# Patient Record
Sex: Female | Born: 1970 | Race: White | Hispanic: No | Smoking: Never smoker
Health system: Southern US, Community
[De-identification: ages and names within clinical notes are randomized; demographics above are authoritative.]

## PROBLEM LIST (undated history)

## (undated) DIAGNOSIS — J301 Allergic rhinitis due to pollen: Secondary | ICD-10-CM

## (undated) DIAGNOSIS — E8940 Asymptomatic postprocedural ovarian failure: Secondary | ICD-10-CM

## (undated) HISTORY — DX: Allergic rhinitis due to pollen: J30.1

## (undated) HISTORY — PX: ABDOMINAL HYSTERECTOMY: SHX81

## (undated) HISTORY — DX: Asymptomatic postprocedural ovarian failure: E89.40

---

## 1998-04-27 ENCOUNTER — Encounter: Payer: Self-pay | Admitting: Obstetrics & Gynecology

## 1998-04-27 ENCOUNTER — Ambulatory Visit (HOSPITAL_COMMUNITY): Admission: RE | Admit: 1998-04-27 | Discharge: 1998-04-27 | Payer: Self-pay | Admitting: Obstetrics & Gynecology

## 1998-10-09 ENCOUNTER — Inpatient Hospital Stay (HOSPITAL_COMMUNITY): Admission: AD | Admit: 1998-10-09 | Discharge: 1998-10-11 | Payer: Self-pay | Admitting: Obstetrics and Gynecology

## 2000-08-08 ENCOUNTER — Encounter (INDEPENDENT_AMBULATORY_CARE_PROVIDER_SITE_OTHER): Payer: Self-pay | Admitting: Specialist

## 2000-08-08 ENCOUNTER — Ambulatory Visit (HOSPITAL_COMMUNITY): Admission: RE | Admit: 2000-08-08 | Discharge: 2000-08-08 | Payer: Self-pay | Admitting: Obstetrics and Gynecology

## 2001-01-30 ENCOUNTER — Ambulatory Visit (HOSPITAL_COMMUNITY): Admission: RE | Admit: 2001-01-30 | Discharge: 2001-01-30 | Payer: Self-pay | Admitting: Obstetrics and Gynecology

## 2001-01-30 ENCOUNTER — Encounter (INDEPENDENT_AMBULATORY_CARE_PROVIDER_SITE_OTHER): Payer: Self-pay | Admitting: *Deleted

## 2001-01-30 ENCOUNTER — Encounter (INDEPENDENT_AMBULATORY_CARE_PROVIDER_SITE_OTHER): Payer: Self-pay | Admitting: Specialist

## 2001-02-05 ENCOUNTER — Encounter: Payer: Self-pay | Admitting: Obstetrics and Gynecology

## 2001-02-05 ENCOUNTER — Ambulatory Visit (HOSPITAL_COMMUNITY): Admission: RE | Admit: 2001-02-05 | Discharge: 2001-02-05 | Payer: Self-pay | Admitting: Obstetrics and Gynecology

## 2001-07-07 ENCOUNTER — Other Ambulatory Visit: Admission: RE | Admit: 2001-07-07 | Discharge: 2001-07-07 | Payer: Self-pay | Admitting: Obstetrics and Gynecology

## 2001-09-03 ENCOUNTER — Encounter: Payer: Self-pay | Admitting: Obstetrics and Gynecology

## 2001-09-03 ENCOUNTER — Ambulatory Visit (HOSPITAL_COMMUNITY): Admission: RE | Admit: 2001-09-03 | Discharge: 2001-09-03 | Payer: Self-pay | Admitting: Obstetrics and Gynecology

## 2002-02-01 ENCOUNTER — Inpatient Hospital Stay (HOSPITAL_COMMUNITY): Admission: AD | Admit: 2002-02-01 | Discharge: 2002-02-03 | Payer: Self-pay | Admitting: Obstetrics and Gynecology

## 2002-11-10 ENCOUNTER — Other Ambulatory Visit: Admission: RE | Admit: 2002-11-10 | Discharge: 2002-11-10 | Payer: Self-pay | Admitting: Obstetrics and Gynecology

## 2003-11-16 ENCOUNTER — Other Ambulatory Visit: Admission: RE | Admit: 2003-11-16 | Discharge: 2003-11-16 | Payer: Self-pay | Admitting: Obstetrics and Gynecology

## 2004-08-08 ENCOUNTER — Ambulatory Visit (HOSPITAL_COMMUNITY): Admission: RE | Admit: 2004-08-08 | Discharge: 2004-08-08 | Payer: Self-pay | Admitting: Obstetrics and Gynecology

## 2004-11-14 ENCOUNTER — Ambulatory Visit (HOSPITAL_COMMUNITY): Admission: RE | Admit: 2004-11-14 | Discharge: 2004-11-14 | Payer: Self-pay | Admitting: Obstetrics and Gynecology

## 2005-02-12 ENCOUNTER — Other Ambulatory Visit: Admission: RE | Admit: 2005-02-12 | Discharge: 2005-02-12 | Payer: Self-pay | Admitting: Obstetrics and Gynecology

## 2010-01-08 ENCOUNTER — Other Ambulatory Visit: Admission: RE | Admit: 2010-01-08 | Discharge: 2010-01-08 | Payer: Self-pay | Admitting: Registered Nurse

## 2010-09-27 NOTE — H&P (Signed)
[facility]  Patient:    Nicole Vazquez, Nicole Vazquez Visit Number: [id number] MRN: [id number]          Service Type: Attending:  Janine Limbo, M.D. Dictated by:   Janine Limbo, M.D. Adm. Date:  01/30/01                           History and Physical  HISTORY OF PRESENT ILLNESS:   The patient is a 40 year old female, para 2-0-1-2, who presents for diagnostic laparoscopy and laparoscopic ovarian cystectomy.  An ultrasound has shown that the patient has a 21.5 x 17.3 cm adnexal cyst on the right.  Her uterus measures 9.2 x 6.0 cm.  The cyst has grown from 14.5 cm.  The patient underwent a diagnostic laparoscopy in March 2002 at which time she was noted to have bilateral ovarian cyst.  The cysts were removed at the time.  The fluid was clear.  No malignant cells were identified.  The cytology from the cyst showed a benign mucinous cyst.  The left ovarian cyst was thought to be a corpus luteum.  The patient reports that she is having more discomfort at this point, and she wishes to proceed with removal of this cyst.  She does want to maintain her childbearing potential.  OBSTETRICAL HISTORY:          The patient has had two vaginal deliveries at term.  PAST MEDICAL HISTORY:         The patient has had her wisdom teeth removed. She denies hypertension and diabetes.  DRUG ALLERGIES:               None.  SOCIAL HISTORY:               The patient denies cigarette use, alcohol use, and recreational drug use.  REVIEW OF SYSTEMS:            Noncontributory.  FAMILY HISTORY:               Noncontributory.  PHYSICAL EXAMINATION:  VITAL SIGNS:                  Weight is 122 pounds.  HEENT:                        Within normal limits.  CHEST:                        Clear.  HEART:                        Regular rate and rhythm.  BREASTS:                      Without masses.  ABDOMEN:                      Nontender.  A mass can be  palpated.  EXTREMITIES:                  Within normal limits.  NEUROLOGIC:                   Normal.  PELVIC:                       External genitalia is normal.  Vagina is normal. Cervix is  normal.  Uterus is normal size, shape, and consistency.   Adnexa: There is a large mass present in the right adnexa.  Rectovaginal exam confirms.  ASSESSMENT:                   A 21 cm cystic mass on the right ovary.  The patient had a mucinous cyst removed from her right ovary previously this year.  PLAN:                         The patient will undergo a open laparoscopy with drainage of a 21 cm right adnexal cyst.  We will then remove the cyst from the capsule of the ovary.  The patient understands the indications for her procedure, and she accepts the risks of, but not limited to, anesthetic complications, bleeding, infections, and possible damage to the surrounding organs. Dictated by:   Janine Limbo, M.D. Attending:  Janine Limbo, M.D. DD:  01/29/01 TD:  01/29/01 Job: [id number]

## 2010-09-27 NOTE — H&P (Signed)
NAME:  Nicole Vazquez, [place]                     ACCOUNT NO.:  [id number]   MEDICAL RECORD NO.:  [id number]                   PATIENT TYPE:  INP   LOCATION:  9170                                 FACILITY:  [facility]   PHYSICIAN:  Osborn Coho, M.D.                DATE OF BIRTH:  05-14-1970   DATE OF ADMISSION:  02/01/2002  DATE OF DISCHARGE:                                HISTORY & PHYSICAL   HISTORY OF PRESENT ILLNESS:  This is a 40 year old gravida 3, para 2-0-0-2,  at 40-2/7 weeks who presents with complaints of regular uterine contractions  every five minutes x1 to 2 hours.  She denies leaking and reports brown show  and positive fetal movement.  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  2)  History of LGA x2.  3) Family history of neural tube defect. 4) Group B  Strep positive.   PRENATAL LABORATORY DATA:  Hemoglobin 12.8, platelets 185, blood type O  positive, antibody screen negative, RPR nonreactive, rubella immune, HBSAG  negative, Pap test normal, gonorrhea and Chlamydia declined, glucose  challenge within normal limits.  Group B Strep positive.   PAST OBSTETRICAL HISTORY:  Remarkable for spontaneous vaginal delivery in  1998 of a female infant named Rowe weighing 9 pounds complicated by high blood  pressure during labor.  She had a spontaneous vaginal delivery in 2000 of a  female infant named Zack, weighing 9 pounds 10 ounces with no complications.   PAST MEDICAL HISTORY:  Remarkable for history of ovarian cysts with a right  ovarian neoplasm which was deemed to be a low grade/low malignant potential  neoplasm and she had a right oophorectomy for that and is currently  undecided about how to proceed regarding her other ovary.  Medical history  is otherwise unremarkable.   FAMILY HISTORY:  Remarkable for a grandfather with heart pacemaker.  Mother  with breast cancer.   PAST SURGICAL HISTORY:   Remarkable for ovarian cystectomy, right  oophorectomy, and wisdom teeth.   GENETIC HISTORY:  Remarkable for a sister with mental retardation and a  first cousin with neural tube defect.   SOCIAL HISTORY:  Remarkable in that the patient is married to Precious Bard  who is involved and supportive.  She is of the [employer].  She denies  any alcohol, tobacco, or drug use.   PHYSICAL EXAMINATION:  VITAL SIGNS: Stable, afebrile.  White blood cell  count 8.4, hemoglobin 14.4, platelets 191.  HEENT:  Within normal limits.  Thyroid normal, not enlarged.  CHEST:  Clear to auscultation.  HEART:  Regular rate and rhythm.  BREASTS:  Soft and nontender.  ABDOMEN:  Gravid at 40 cm.  Vertex to Leopold's.  EFM shows fetal heart rate  of 140s with small accelerations and occasional early decelerations.  Uterine contractions every two to three minutes.  PELVIC:  Cervical examination  initially was 6 cm and is now 7 cm, 100%  effaced, and 0 station with vertex presentation.  EXTREMITIES: Within normal limits.   ASSESSMENT:  1. Intrauterine pregnancy at term.  2. Active labor.  3. Group B Strep positive.   PLAN:  Admit to birthing suite with Dr. Su Hilt notified.  Routine C.N.M.  orders.  Penicillin prophylaxis.  Epidural.  Anticipate SVD.     Marie L. Williams, C.N.M.                 Osborn Coho, M.D.    MLW/MEDQ  D:  02/01/2002  T:  02/01/2002  Job:  [id number]

## 2010-09-27 NOTE — H&P (Signed)
[facility]  Patient:    Nicole Vazquez, Nicole Vazquez                    MRN: [id number] Adm. Date:  08/08/00 Attending:  Janine Limbo, M.D.                         History and Physical  HISTORY OF PRESENT ILLNESS:  The patient is a 40 year old female, para 2-0-1-2, who presents for diagnostic laparoscopy because of persistent cysts on her ovaries and because of pain.  Ultrasound on July 24, 2000, showed a 12.7 x 5.2 cm cyst on the right and also a 5.0 x 4.2 cm on the right.  On the left there was a 1.5 x 1.4 cm cyst.  OBSTETRICAL HISTORY:  The patient has had 2 vaginal deliveries at term.  DRUG ALLERGIES:  None.  PAST MEDICAL HISTORY:  The patient denies hypertension and diabetes.  She did have her wisdom teeth removed.  SOCIAL HISTORY:  The patient denies cigarette use, alcohol use, and recreational drug use.  REVIEW OF SYSTEMS:  Noncontributory.  FAMILY HISTORY:  Noncontributory.  PHYSICAL EXAMINATION:  GENERAL:  Weight is 125 pounds.  HEENT:  Within normal limits.  CHEST:  Clear.  HEART:  Regular rate and rhythm.  BREASTS:  Without masses.  ABDOMEN:  Nontender.  EXTREMITIES:  Within normal limits.  NEUROLOGIC:  Normal.  PELVIC EXAM:  External genitalia is normal.  Vagina is normal.  CERVIX:  Normal.  UTERUS: Normal size, shape and consistency.  ADNEXA: There is a 10 cm firm mass present on the right, rectovaginal exam confirms.  ASSESSMENT:  12 cm complex cyst on the right ovary.  PLAN:  The patient will undergo a diagnostic laparoscopy.  She understands that there is the possibility that we will need to proceed with laparotomy. The patient understands the indications for her procedure and she accepts the risks of, but not limited to, anesthetic complications, bleeding, infections, and possible damage to the surrounding organs. DD:  08/07/00 TD:  08/07/00 Job: [id number]

## 2010-09-27 NOTE — Op Note (Signed)
[facility]  Patient:    Nicole Vazquez, Nicole Vazquez Visit Number: [id number] MRN: [id number]          Service Type: DSU Location: [facility] Attending Physician:  Leonard Schwartz Dictated by:   Janine Limbo, M.D. Proc. Date: 01/30/01 Admit Date:  01/30/2001                             Operative Report  PREOPERATIVE DIAGNOSES:       1. A 21.5-cm right ovarian cyst.                               2. Scarred right fallopian tube.  POSTOPERATIVE DIAGNOSES:      1. A 21.5 cm right ovarian cyst.                               2. Scarred right fallopian tube.  PROCEDURE:                    1. Diagnostic laparoscopy.                               2. Laparoscopic right salpingo-oophorectomy.  SURGEON:                      Janine Limbo, M.D.  ANESTHESIA:                   General.  DISPOSITION:                  Nicole Vazquez is a 40 year old female who presents with a 21-cm cyst on her right ovary. In March of 2002 the patient underwent a diagnostic laparoscopy with removal of a benign mucinous cyst from her right ovary. In August she was found to have a 14-cm cyst and it quickly grew to 21.5 cm. The patient understands the indications for her procedure and she accepts the risk of, but not limited to, anesthetic complications, bleeding, infections, and the possible damage to the surrounding organs. She understands that there is a possibility that we may need to proceed with oophorectomy rather than cystectomy. She also understands that we may need to proceed with diagnostic laparotomy rather than laparoscopy.  FINDINGS:                     There was a 21-cm cystic mass on the right ovary. The fluid inside was moderately thin and clear. There were no excrescences on the surfaces of the ovaries, on the fallopian tubes, or on the bowel. There was no evidence of malignancy. The right fallopian tube was elongated and was adherent to the posterior  surface of the cystic mass. The uterus, left fallopian tube, and left ovary appeared completely normal. The fimbriated end of the left ovary was delicate. The bowel, appendix, and liver were visualized and were thought to be within normal limits. There was no evidence of endometriosis present.  DESCRIPTION OF PROCEDURE:     The patient was taken to the operating room where a general anesthetic was given. The patients abdomen, perineum, and vagina were prepped with multiple layers of Betadine. Examination under anesthesia was performed. A Foley catheter was placed in the bladder. A Hulka tenaculum was  placed inside the uterus. The patient was sterilely draped. The subumbilical area was injected with 5 cc of 0.5% Marcaine. An incision was made and it was carried sharply through the subcutaneous layer, the fascia, and the anterior peritoneum. Interrupted sutures of O Vicryl were placed in the corners of the incision. A needle was placed inside the mass and 200 cc of fluid was removed. At this point, we felt comfortable placing the Hasson cannula. It was held into place using O Vicryl. The pelvic structures were visualized with findings as mentioned above. Pictures were taken of the patients pelvic anatomy. The ureters were identified bilaterally. They were noted to be away from our surgical areas. The capsule of the cystic mass was then cauterized using the unipolar cautery. An incision was made and the cyst was dissected from the ovarian tissue. Bleeding was encountered as we proceeded with our dissection. Hemostasis was achieved using the bipolar cautery. We were able to dissect the cystic structure from the ovarian stroma. Again, brisk bleeding was encountered. At this point, we noted that the right fallopian tube was elongated and slightly scarred. The decision was made to proceed with right salpingo-oophorectomy because of our concern that the fallopian tube would not be functional,  because of our concern for recurrence of the mass, and because we had already had a significant amount of bleeding from the ovarian stroma. We then isolated the right infundibulopelvic ligament. The ligament was cauterized. Again, care was taken to identify the ureter and to be sure that there was no evidence of damage. The infundibulopelvic ligament was cauterized, cut, and then two O Vicryl Endoloops were placed around the ligament. Hemostasis was noted to be adequate. The pelvis was then vigorously irrigated. The bowel was carefully inspected and there was no evidence of trocar damage. The pneumoperitoneum was allowed to escape. All instruments were removed. The umbilical incision was closed using 4-0 Vicryl. The suprapubic incisions that were used for our surgery were then closed using 4-0 Vicryl. Sponge, needle, and instrument counts were correct on two occasions. The estimated blood loss was 350 cc. The patient tolerated her procedure well. The patient was transported to the recovery room in stable condition. Fluid from the cyst was sent to cytology. The right ovary and the right ovarian cyst were sent to pathology.  FOLLOWUP INSTRUCTIONS:        The patient will return to see Dr. Stefano Gaul in two to three weeks for followup examination. She was given a copy of the postoperative instruction sheet as prepared by the [facility] for patients who have undergone laparoscopy. She was told to call for questions or concerns. Dictated by:   Janine Limbo, M.D. Attending Physician:  Leonard Schwartz DD:  01/30/01 TD:  01/30/01 Job: [id number]

## 2010-09-27 NOTE — Op Note (Signed)
[facility] of [facility]  Patient:    Nicole Vazquez, Nicole Vazquez                  MRN: [id number] Proc. Date: 08/08/00 Adm. Date:  [id number] Attending:  Leonard Schwartz                           Operative Report  PREOPERATIVE DIAGNOSES:       1. Pelvic pain.                               2. Right ovarian cyst, 12 cm.                               3. Left ovarian cyst, 1.5 x 1.2 cm.  POSTOPERATIVE DIAGNOSES:      1. Pelvic pain.                               2. Right ovarian cyst, 12 cm.                               3. Left ovarian cyst, 1.5 x 1.2 cm.  PROCEDURE:                    Diagnostic laparoscopy with laparoscopic bilateral ovarian cystectomies.  SURGEON:                      Janine Limbo, M.D.  ANESTHESIA:                   General.  INDICATIONS:                  The patient is a 40 year old female, para 2-0-0-2 who presents with the above-mentioned diagnoses.  The patient understands the indications for her procedure and she accepts the risks of but not limited to anesthetic complications, bleeding, infection and possible damage to surrounding organs.  FINDINGS:                     There was a 12 cm cystic right ovary.  There was a smaller cyst on the left ovary that measured approximately 2 cm.  The fallopian tubes, uterus, bowel and upper abdomen appeared normal.  There were adhesions between the appendix and the right pelvic side wall.  There was no evidence of endometriosis.  Both cysts contained clear fluid.  DESCRIPTION OF PROCEDURE:     The patient was taken to the operating room, where a general anesthetic was given.  The patients abdomen, perineum and vagina were prepped with multiple layers of Betadine.  Examination under anesthesia was performed.  A Foley catheter was placed in the bladder.  A Hulka tenaculum was placed inside the uterus.  The patient was sterilely draped.  The subumbilical area was injected with 0.5% Marcaine.   An incision was made and a Veress needle was inserted without difficulty.  Proper placement was confirmed using the saline drop test.  A pneumoperitoneum was obtained.  The laparoscopic trocar and then the laparoscope were substituted for the Veress needle.  The pelvic structures were visualized with findings as mentioned above.  Two suprapubic areas were injected with 0.5% Marcaine  and two 5 mm trocars were placed in the lower abdomen.  Pictures were taken of the patients pelvic anatomy.  The ovarian cyst on the right would move.  it was ruptured during the removal process and the fluid was aspirated and sent to cytology.  The capsule of the ovary was completely removed.  Hemostasis was obtained.  We did attempt to suture the capsule back together, but this was unsuccessful.  We felt that, because hemostasis was adequate, that we would not proceed any further with this endeavor.  A smaller cyst in the right ovary was then aspirated and sent with the original.  The left ovary was then injected with saline and the cyst was removed from the left ovary using a combination of blunt and sharp dissection.  This cyst also ruptured during the removal process.  Hemostasis was achieved.  The pelvis was vigorously irrigated.  There was no evidence of oily material inside the abdominal cavity and there was no evidence of a dermoid in either ovary.  We then made a final check for hemostasis and, again, hemostasis was adequate.  All instruments were then removed after the pneumoperitoneum was allowed to escape.  The bowel was carefully inspected and there was no evidence of trocar damage to the bowel.  The abdominal trocar was then removed.  The incisions were closed with using deep and superficial sutures of 4-0 Vicryl.  Sponge, needle and instrument counts were correct.  Estimated blood loss was 20 cc.  The patient tolerated her procedure well.  She was awakened from her anesthetic and taken to the  recovery room in stable condition.  The patient was given IV Cefotan and IV Toradol during her procedure.  FOLLOW-UP INSTRUCTIONS:       The patient has Vicodin at home for pain.  She will use ibuprofen p.r.n., as well.  She will return to see Dr. Stefano Gaul in 2-3 weeks for follow-up examination.  She was given a copy of the post instruction sheet as prepared by the [facility] for patients who have undergone laparoscopy. DD:  08/08/00 TD:  08/08/00 Job: [id number]

## 2011-05-20 ENCOUNTER — Other Ambulatory Visit: Payer: Self-pay | Admitting: Internal Medicine

## 2011-05-20 DIAGNOSIS — Z1231 Encounter for screening mammogram for malignant neoplasm of breast: Secondary | ICD-10-CM

## 2011-06-05 ENCOUNTER — Ambulatory Visit
Admission: RE | Admit: 2011-06-05 | Discharge: 2011-06-05 | Disposition: A | Discharge status: Home / Self Care | Payer: BC Managed Care – PPO | Source: Ambulatory Visit | Attending: Internal Medicine | Admitting: Internal Medicine

## 2011-06-05 DIAGNOSIS — Z1231 Encounter for screening mammogram for malignant neoplasm of breast: Secondary | ICD-10-CM

## 2012-05-18 ENCOUNTER — Other Ambulatory Visit (HOSPITAL_COMMUNITY): Payer: Self-pay | Admitting: Internal Medicine

## 2012-05-18 DIAGNOSIS — Z1231 Encounter for screening mammogram for malignant neoplasm of breast: Secondary | ICD-10-CM

## 2012-06-08 ENCOUNTER — Ambulatory Visit (HOSPITAL_COMMUNITY)
Admission: RE | Admit: 2012-06-08 | Discharge: 2012-06-08 | Disposition: A | Discharge status: Home / Self Care | Payer: BC Managed Care – PPO | Source: Ambulatory Visit | Attending: Internal Medicine | Admitting: Internal Medicine

## 2012-06-08 DIAGNOSIS — Z1231 Encounter for screening mammogram for malignant neoplasm of breast: Secondary | ICD-10-CM | POA: Insufficient documentation

## 2012-06-16 ENCOUNTER — Other Ambulatory Visit: Payer: Self-pay | Admitting: Registered Nurse

## 2012-06-16 ENCOUNTER — Other Ambulatory Visit (HOSPITAL_COMMUNITY)
Admission: RE | Admit: 2012-06-16 | Discharge: 2012-06-16 | Disposition: A | Discharge status: Home / Self Care | Payer: BC Managed Care – PPO | Source: Ambulatory Visit | Attending: Internal Medicine | Admitting: Internal Medicine

## 2012-06-16 DIAGNOSIS — Z01419 Encounter for gynecological examination (general) (routine) without abnormal findings: Secondary | ICD-10-CM | POA: Insufficient documentation

## 2012-09-23 ENCOUNTER — Other Ambulatory Visit: Payer: Self-pay | Admitting: Dermatology

## 2013-06-08 ENCOUNTER — Other Ambulatory Visit (HOSPITAL_COMMUNITY): Payer: Self-pay | Admitting: Internal Medicine

## 2013-06-08 DIAGNOSIS — Z1231 Encounter for screening mammogram for malignant neoplasm of breast: Secondary | ICD-10-CM

## 2013-06-16 ENCOUNTER — Ambulatory Visit (HOSPITAL_COMMUNITY)
Admission: RE | Admit: 2013-06-16 | Discharge: 2013-06-16 | Disposition: A | Discharge status: Home / Self Care | Payer: BC Managed Care – PPO | Source: Ambulatory Visit | Attending: Internal Medicine | Admitting: Internal Medicine

## 2013-06-16 DIAGNOSIS — Z1231 Encounter for screening mammogram for malignant neoplasm of breast: Secondary | ICD-10-CM | POA: Insufficient documentation

## 2014-09-22 ENCOUNTER — Other Ambulatory Visit (HOSPITAL_COMMUNITY): Payer: Self-pay | Admitting: Internal Medicine

## 2014-09-22 DIAGNOSIS — Z1231 Encounter for screening mammogram for malignant neoplasm of breast: Secondary | ICD-10-CM

## 2014-09-29 ENCOUNTER — Ambulatory Visit (HOSPITAL_COMMUNITY)
Admission: RE | Admit: 2014-09-29 | Discharge: 2014-09-29 | Disposition: A | Discharge status: Home / Self Care | Payer: 59 | Source: Ambulatory Visit | Attending: Internal Medicine | Admitting: Internal Medicine

## 2014-09-29 DIAGNOSIS — Z1231 Encounter for screening mammogram for malignant neoplasm of breast: Secondary | ICD-10-CM | POA: Diagnosis present

## 2015-09-12 ENCOUNTER — Other Ambulatory Visit: Payer: Self-pay

## 2015-09-12 DIAGNOSIS — Z1231 Encounter for screening mammogram for malignant neoplasm of breast: Secondary | ICD-10-CM

## 2015-10-02 ENCOUNTER — Ambulatory Visit
Admission: RE | Admit: 2015-10-02 | Discharge: 2015-10-02 | Disposition: A | Discharge status: Home / Self Care | Payer: BLUE CROSS/BLUE SHIELD | Source: Ambulatory Visit

## 2015-10-02 DIAGNOSIS — Z1231 Encounter for screening mammogram for malignant neoplasm of breast: Secondary | ICD-10-CM

## 2016-03-20 DIAGNOSIS — D1800 Hemangioma unspecified site: Secondary | ICD-10-CM | POA: Diagnosis not present

## 2016-03-20 DIAGNOSIS — Z85828 Personal history of other malignant neoplasm of skin: Secondary | ICD-10-CM | POA: Diagnosis not present

## 2016-03-20 DIAGNOSIS — D225 Melanocytic nevi of trunk: Secondary | ICD-10-CM | POA: Diagnosis not present

## 2016-03-20 DIAGNOSIS — D2261 Melanocytic nevi of right upper limb, including shoulder: Secondary | ICD-10-CM | POA: Diagnosis not present

## 2016-04-08 DIAGNOSIS — D229 Melanocytic nevi, unspecified: Secondary | ICD-10-CM | POA: Diagnosis not present

## 2017-01-06 DIAGNOSIS — Z1231 Encounter for screening mammogram for malignant neoplasm of breast: Secondary | ICD-10-CM | POA: Diagnosis not present

## 2017-01-06 DIAGNOSIS — C4491 Basal cell carcinoma of skin, unspecified: Secondary | ICD-10-CM | POA: Insufficient documentation

## 2017-01-06 DIAGNOSIS — Z124 Encounter for screening for malignant neoplasm of cervix: Secondary | ICD-10-CM | POA: Diagnosis not present

## 2017-01-06 DIAGNOSIS — Z01411 Encounter for gynecological examination (general) (routine) with abnormal findings: Secondary | ICD-10-CM | POA: Diagnosis not present

## 2017-01-06 DIAGNOSIS — Z6821 Body mass index (BMI) 21.0-21.9, adult: Secondary | ICD-10-CM | POA: Diagnosis not present

## 2017-03-24 DIAGNOSIS — D225 Melanocytic nevi of trunk: Secondary | ICD-10-CM | POA: Diagnosis not present

## 2017-03-24 DIAGNOSIS — D2261 Melanocytic nevi of right upper limb, including shoulder: Secondary | ICD-10-CM | POA: Diagnosis not present

## 2017-03-24 DIAGNOSIS — D1800 Hemangioma unspecified site: Secondary | ICD-10-CM | POA: Diagnosis not present

## 2017-03-24 DIAGNOSIS — Z85828 Personal history of other malignant neoplasm of skin: Secondary | ICD-10-CM | POA: Diagnosis not present

## 2017-07-03 DIAGNOSIS — Z13220 Encounter for screening for lipoid disorders: Secondary | ICD-10-CM | POA: Diagnosis not present

## 2017-07-03 DIAGNOSIS — Z0000 Encounter for general adult medical examination without abnormal findings: Secondary | ICD-10-CM | POA: Diagnosis not present

## 2017-07-03 DIAGNOSIS — Z1329 Encounter for screening for other suspected endocrine disorder: Secondary | ICD-10-CM | POA: Diagnosis not present

## 2017-07-03 DIAGNOSIS — Z114 Encounter for screening for human immunodeficiency virus [HIV]: Secondary | ICD-10-CM | POA: Diagnosis not present

## 2017-07-06 DIAGNOSIS — D72818 Other decreased white blood cell count: Secondary | ICD-10-CM | POA: Diagnosis not present

## 2017-07-06 DIAGNOSIS — Z0000 Encounter for general adult medical examination without abnormal findings: Secondary | ICD-10-CM | POA: Diagnosis not present

## 2017-07-06 DIAGNOSIS — Z681 Body mass index (BMI) 19 or less, adult: Secondary | ICD-10-CM | POA: Diagnosis not present

## 2017-07-07 LAB — LIPID PANEL
Cholesterol: 288 — AB (ref 0–200)
HDL: 140 — AB (ref 35–70)
LDL Cholesterol: 141
Triglycerides: 37 — AB (ref 40–160)

## 2018-01-12 DIAGNOSIS — Z124 Encounter for screening for malignant neoplasm of cervix: Secondary | ICD-10-CM | POA: Diagnosis not present

## 2018-01-12 DIAGNOSIS — Z01411 Encounter for gynecological examination (general) (routine) with abnormal findings: Secondary | ICD-10-CM | POA: Diagnosis not present

## 2018-01-12 DIAGNOSIS — Z6821 Body mass index (BMI) 21.0-21.9, adult: Secondary | ICD-10-CM | POA: Diagnosis not present

## 2018-01-12 DIAGNOSIS — Z1231 Encounter for screening mammogram for malignant neoplasm of breast: Secondary | ICD-10-CM | POA: Diagnosis not present

## 2018-01-12 DIAGNOSIS — N951 Menopausal and female climacteric states: Secondary | ICD-10-CM | POA: Diagnosis not present

## 2018-03-01 DIAGNOSIS — Z681 Body mass index (BMI) 19 or less, adult: Secondary | ICD-10-CM | POA: Diagnosis not present

## 2018-03-01 DIAGNOSIS — J309 Allergic rhinitis, unspecified: Secondary | ICD-10-CM | POA: Diagnosis not present

## 2018-03-01 DIAGNOSIS — J019 Acute sinusitis, unspecified: Secondary | ICD-10-CM | POA: Diagnosis not present

## 2018-06-07 ENCOUNTER — Ambulatory Visit: Payer: BLUE CROSS/BLUE SHIELD | Admitting: Family Medicine

## 2018-07-14 ENCOUNTER — Encounter: Payer: Self-pay | Admitting: Family Medicine

## 2018-07-14 ENCOUNTER — Other Ambulatory Visit: Payer: Self-pay

## 2018-07-14 ENCOUNTER — Ambulatory Visit (INDEPENDENT_AMBULATORY_CARE_PROVIDER_SITE_OTHER): Payer: 59 | Admitting: Family Medicine

## 2018-07-14 VITALS — BP 108/72 | HR 67 | Temp 98.6°F | Resp 14 | Ht 65.0 in | Wt 124.8 lb

## 2018-07-14 DIAGNOSIS — Z23 Encounter for immunization: Secondary | ICD-10-CM

## 2018-07-14 DIAGNOSIS — E049 Nontoxic goiter, unspecified: Secondary | ICD-10-CM | POA: Diagnosis not present

## 2018-07-14 DIAGNOSIS — J301 Allergic rhinitis due to pollen: Secondary | ICD-10-CM

## 2018-07-14 DIAGNOSIS — E8940 Asymptomatic postprocedural ovarian failure: Secondary | ICD-10-CM

## 2018-07-14 HISTORY — DX: Allergic rhinitis due to pollen: J30.1

## 2018-07-14 NOTE — Progress Notes (Signed)
Subjective  CC:  Chief Complaint  Patient presents with  . Establish Care    Previous pt of Dr. Dewey/P.A. Dot Been.. Last CPE 05/2017  . Adenopathy    Left side of neck, after sinus infection    HPI: Nicole Vazquez is a 48 y.o. female who presents to [place] at [facility] today to establish care with me as a new patient.   She has the following concerns or needs:  Very pleasant 48 year old married female, mother of 2 sons, presents to establish care.  Overall very healthy.  Lives a healthy lifestyle.  Eats well, exercises and takes multiple over-the-counter supplements, mainly herbal.  She has no chronic medical conditions although she has minor allergy and sinus symptoms more related to cold and weather changes.  In the fall she had a sinus infection that was treated.  At that time she noticed a fullness in her left side of her neck.  She thought this was related to lymphadenopathy.  She never had tenderness or felt a localized mass.  This fullness has persisted.  She denies odynophagia or dysphagia.  No shortness of breath.  No change in energy level.  No palpitations, sweats or unwanted weight changes.  Her mother passed away from metastatic breast cancer.  She had some atypical possibly ovarian epithelial cells on an ovarian cyst path which led her to have a complete hysterectomy, supracervical.  She is concerned about this fullness being related to cancer.  She is up-to-date on her mammogram and Pap smears.  She most recently saw Dr. Raphael Gibney.  We will request records.  Assessment  1. Enlarged thyroid   2. Seasonal allergic rhinitis due to pollen   3. Need for immunization against influenza   4. Premature surgical menopause      Plan   Enlarged thyroid, left greater than right: Refer for ultrasound.  Counseling given.  No palpable nodules or masses.  No lymphadenopathy.  Reassured.  Follow-up pending ultrasound results.  She will return for complete  physical at which time we will check thyroid functions as well.  Mild allergies, supportive care  Premature surgical menopause: Was placed on HRT only briefly due to mother's history of breast cancer.  Discussed risk of osteoporosis.  Continue calcium, vitamin D and weightbearing exercises.  Currently without menopausal symptoms.  She does take a women's health herbal supplement.  Updated influenza vaccination today.  May be due for Tdap, she will check.  Return for complete physical.  Follow up:  Return in about 3 months (around 10/14/2018) for complete physical. Orders Placed This Encounter  Procedures  . US THYROID  . Flu Vaccine QUAD 36+ mos IM   No orders of the defined types were placed in this encounter.    Depression screen PHQ 2/9 07/14/2018  Decreased Interest 0  Down, Depressed, Hopeless 0  PHQ - 2 Score 0    We updated and reviewed the patient's past history in detail and it is documented below.  Patient Active Problem List   Diagnosis Date Noted  . Seasonal allergic rhinitis due to pollen 07/14/2018  . Premature surgical menopause    Health Maintenance  Topic Date Due  . HIV Screening  12/05/1985  . TETANUS/TDAP  12/05/1989  . PAP SMEAR-Modifier  06/17/2015  . MAMMOGRAM  10/01/2016  . INFLUENZA VACCINE  12/10/2017   Immunization History  Administered Date(s) Administered  . Influenza,inj,Quad PF,6+ Mos 07/14/2018   Current Meds  Medication Sig  . Ascorbic Acid (  VITAMIN C) 1000 MG tablet Take 1,000 mg by mouth 2 (two) times daily.  Marland Kitchen Echinacea-Goldenseal (ECHINACEA COMB/GOLDEN SEAL PO) Take by mouth.  . Nettle, Urtica Dioica, (NETTLE LEAF PO) Take by mouth 2 (two) times daily.  . Omega-3 1000 MG CAPS Take 1 capsule by mouth daily.  . OREGANO PO Take by mouth.  Marland Kitchen UNABLE TO FIND Med Name: Herbal Equilibrium 2 times a day  . UNABLE TO FIND Med Name: Super Quercetin    Allergies: Patient is allergic to guaifenesin & derivatives and vicodin  [hydrocodone-acetaminophen]. Past Medical History Patient  has a past medical history of Premature surgical menopause and Seasonal allergic rhinitis due to pollen (07/14/2018). Past Surgical History Patient  has a past surgical history that includes Abdominal hysterectomy. Family History: Patient family history includes Arthritis in her father; Asthma in her mother; Atrial fibrillation in her father; Breast cancer in her mother; COPD in her paternal grandmother; Healthy in her son, son, and son; Hearing loss in her maternal grandmother; Heart disease in her paternal grandfather; Mental retardation in her sister. Social History:  Patient  reports that she has never smoked. She has never used smokeless tobacco. She reports that she does not drink alcohol or use drugs.  Review of Systems: Constitutional: negative for fever or malaise Ophthalmic: negative for photophobia, double vision or loss of vision Cardiovascular: negative for chest pain, dyspnea on exertion, or new LE swelling Respiratory: negative for SOB or persistent cough Gastrointestinal: negative for abdominal pain, change in bowel habits or melena Genitourinary: negative for dysuria or gross hematuria Musculoskeletal: negative for new gait disturbance or muscular weakness Integumentary: negative for new or persistent rashes Neurological: negative for TIA or stroke symptoms Psychiatric: negative for SI or delusions Allergic/Immunologic: negative for hives  Patient Care Team    Relationship Specialty Notifications Start End  Leamon Arnt, MD PCP - General Family Medicine  07/14/18   Ena Dawley, MD Consulting Physician Obstetrics and Gynecology  07/14/18     Objective  Vitals: BP 108/72   Pulse 67   Temp 98.6 F (37 C) (Oral)   Resp 14   Ht 5\' 5"  (1.651 m)   Wt 124 lb 12.8 oz (56.6 kg)   SpO2 98%   BMI 20.77 kg/m  General:  Well developed, well nourished, no acute distress, appears healthy Psych:  Alert and  oriented,normal mood and affect HEENT:  Normocephalic, atraumatic, non-icteric sclera, PERRL, oropharynx is without mass or exudate, supple neck without adenopathy, thyromegaly is present with left fullness, no tenderness or nodules Cardiovascular:  RRR without gallop, rub or murmur, nondisplaced PMI Respiratory:  Good breath sounds bilaterally, CTAB with normal respiratory effort Gastrointestinal: normal bowel sounds, soft, non-tender, no noted masses. No HSM MSK: no deformities, contusions. Joints are without erythema or swelling Skin:  Warm, no rashes or suspicious lesions noted Neurologic:    Mental status is normal.  No tremor   Commons side effects, risks, benefits, and alternatives for medications and treatment plan prescribed today were discussed, and the patient expressed understanding of the given instructions. Patient is instructed to call or message via MyChart if he/she has any questions or concerns regarding our treatment plan. No barriers to understanding were identified. We discussed Red Flag symptoms and signs in detail. Patient expressed understanding regarding what to do in case of urgent or emergency type symptoms.   Medication list was reconciled, printed and provided to the patient in AVS. Patient instructions and summary information was reviewed with the patient as  documented in the AVS. This note was prepared with assistance of Dragon voice recognition software. Occasional wrong-word or sound-a-like substitutions may have occurred due to the inherent limitations of voice recognition software

## 2018-07-14 NOTE — Patient Instructions (Signed)
Please return for your annual complete physical; please come fasting. Please sign a release of records for Dr. Doran Stabler office.   Please sign up for Mychart.   We will call you with information regarding your referral appointment. For a thyroid ultrasound. If you do not hear from [place] within the next 2 weeks, please let me know.   Today you were given your influenza vaccination.  Check to see when you had your last Tdap or tetanus vaccine.    It was a pleasure meeting you today! Thank you for choosing [place] to meet your healthcare needs! I truly look forward to working with you. If you have any questions or concerns, please send me a message via Mychart or call the office at [phone].   Goiter  A goiter is an enlarged thyroid gland. The thyroid is located in the lower front of the neck. It makes hormones that affect many body parts and systems, including the system that affects how quickly the body burns fuel for energy (metabolism). Most goiters are painless and are not a cause for concern. Some goiters can affect the way your thyroid makes thyroid hormones. Goiters and conditions that cause goiters can be treated, if necessary. What are the causes? Common causes of this condition include:  Lack (deficiency) of a mineral called iodine. The thyroid gland uses iodine to make thyroid hormones.  Diseases that attack healthy cells in the body (autoimmune diseases) and affect thyroid function, such as Graves' disease or Hashimoto's disease. These diseases may cause the body to produce too much thyroid hormone (hyperthyroidism) or too little of the hormone (hypothyroidism).  Conditions that cause inflammation of the thyroid (thyroiditis).  One or more small growths on the thyroid (nodular goiter). Other causes include:  Medical problems caused by abnormal genes that are passed from parent to child (genetic defects).  Thyroid injury or infection.  Tumors that may or may not be  cancerous.  Pregnancy.  Certain medicines.  Exposure to radiation. In some cases, the cause may not be known. What increases the risk? This condition is more likely to develop in:  People who do not get enough iodine in their diet.  People who have a family history of goiter.  Women.  People who are older than age 59.  People who smoke tobacco.  People who have had exposure to radiation. What are the signs or symptoms? The main symptom of this condition is swelling in the lower, front part of the neck. This swelling can range from a very small bump to a large lump. Other symptoms may include:  A tight feeling in the throat.  A hoarse voice.  Coughing.  Wheezing.  Difficulty swallowing or breathing.  Bulging veins in the neck.  Dizziness. When a goiter is the result of an overactive thyroid (hyperthyroidism), symptoms may also include:  Nervousness or restlessness.  Inability to tolerate heat.  Unexplained weight loss.  Diarrhea.  Change in the texture of hair or skin.  Changes in heartbeat, such as skipped beats, extra beats, or a rapid heart rate.  Loss of menstruation.  Shaky hands.  Increased appetite.  Sleep problems. When a goiter is the result of an underactive thyroid (hypothyroidism), symptoms may also include:  Feeling like you have no energy (lethargy).  Inability to tolerate cold.  Weight gain that is not explained by a change in diet or exercise habits.  Dry skin.  Coarse hair.  Irregular menstrual periods.  Constipation.  Sadness or depression.  Fatigue. In  some cases, there may not be any symptoms and the thyroid hormone levels may be normal. How is this diagnosed? This condition may be diagnosed based on your symptoms, your medical history, and a physical exam. You may have tests, such as:  Blood tests to check thyroid function.  Imaging tests, such as: ? Ultrasound. ? CT scan. ? MRI. ? Thyroid scan.  Removal of  a tissue sample (biopsy) of the goiter or any nodules. The sample will be tested to check for cancer. How is this treated? Treatment for this condition depends on the cause and your symptoms. Treatment may include:  Medicines to regulate thyroid hormone levels.  Anti-inflammatory medicines or steroid medicines, if the goiter is caused by inflammation.  Iodine supplements or changes to your diet, if the goiter is caused by iodine deficiency.  Radioactive iodine treatment.  Surgery to remove your thyroid. In some cases, you may only need regular check-ups with your health care provider to monitor your condition, and you may not need treatment. Follow these instructions at home:  Follow instructions from your health care provider about any changes to your diet.  Take over-the-counter and prescription medicines only as told by your health care provider. These include supplements.  Do not use any products that contain nicotine or tobacco, such as cigarettes and e-cigarettes. If you need help quitting, ask your health care provider.  Keep all follow-up visits as told by your health care provider. This is important. Contact a health care provider if:  Your symptoms do not get better with treatment.  You have nausea, vomiting, or diarrhea. Get help right away if:  You have sudden, unexplained confusion or other mental changes.  You have a fever.  You have chest pain.  You have trouble breathing or swallowing.  You suddenly become very weak.  You experience extreme restlessness.  You feel your heart racing. Summary  A goiter is an enlarged thyroid gland.  The thyroid gland is located in the lower front of the neck. It makes hormones that affect many body parts and systems, including the system that affects how quickly the body burns fuel for energy (metabolism).  The main symptom of this condition is swelling in the lower, front part of the neck. This swelling can range from a  very small bump to a large lump.  Treatment for this condition depends on the cause and your symptoms. You may need medicines, supplements, or regular monitoring of your condition. This information is not intended to replace advice given to you by your health care provider. Make sure you discuss any questions you have with your health care provider. Document Released: 10/16/2009 Document Revised: 01/22/2017 Document Reviewed: 01/22/2017 Elsevier Interactive Patient Education  [employer].

## 2018-07-16 ENCOUNTER — Other Ambulatory Visit: Payer: 59

## 2018-07-23 ENCOUNTER — Ambulatory Visit
Admission: RE | Admit: 2018-07-23 | Discharge: 2018-07-23 | Disposition: A | Discharge status: Home / Self Care | Payer: 59 | Source: Ambulatory Visit | Attending: Family Medicine | Admitting: Family Medicine

## 2018-07-23 DIAGNOSIS — E049 Nontoxic goiter, unspecified: Secondary | ICD-10-CM

## 2018-07-26 ENCOUNTER — Encounter: Payer: Self-pay | Admitting: *Deleted

## 2018-09-28 ENCOUNTER — Encounter: Payer: 59 | Admitting: Family Medicine

## 2018-10-29 ENCOUNTER — Other Ambulatory Visit: Payer: Self-pay

## 2018-10-29 DIAGNOSIS — Z20822 Contact with and (suspected) exposure to covid-19: Secondary | ICD-10-CM

## 2018-11-01 LAB — NOVEL CORONAVIRUS, NAA: SARS-CoV-2, NAA: NOT DETECTED

## 2018-12-09 ENCOUNTER — Ambulatory Visit (INDEPENDENT_AMBULATORY_CARE_PROVIDER_SITE_OTHER): Payer: 59 | Admitting: Family Medicine

## 2018-12-09 ENCOUNTER — Encounter: Payer: 59 | Admitting: Family Medicine

## 2018-12-09 ENCOUNTER — Encounter: Payer: Self-pay | Admitting: Family Medicine

## 2018-12-09 ENCOUNTER — Other Ambulatory Visit: Payer: Self-pay

## 2018-12-09 VITALS — BP 106/70 | HR 55 | Temp 98.3°F | Resp 14 | Ht 65.0 in | Wt 124.2 lb

## 2018-12-09 DIAGNOSIS — Z1239 Encounter for other screening for malignant neoplasm of breast: Secondary | ICD-10-CM

## 2018-12-09 DIAGNOSIS — Z23 Encounter for immunization: Secondary | ICD-10-CM

## 2018-12-09 DIAGNOSIS — Z0000 Encounter for general adult medical examination without abnormal findings: Secondary | ICD-10-CM

## 2018-12-09 LAB — COMPREHENSIVE METABOLIC PANEL
ALT: 20 U/L (ref 0–35)
AST: 21 U/L (ref 0–37)
Albumin: 4.6 g/dL (ref 3.5–5.2)
Alkaline Phosphatase: 70 U/L (ref 39–117)
BUN: 14 mg/dL (ref 6–23)
CO2: 27 mEq/L (ref 19–32)
Calcium: 9.7 mg/dL (ref 8.4–10.5)
Chloride: 103 mEq/L (ref 96–112)
Creatinine, Ser: 0.77 mg/dL (ref 0.40–1.20)
GFR: 80.01 mL/min (ref >60.00)
Glucose, Bld: 85 mg/dL (ref 70–99)
Potassium: 4.2 mEq/L (ref 3.5–5.1)
Sodium: 138 mEq/L (ref 135–145)
Total Bilirubin: 0.5 mg/dL (ref 0.2–1.2)
Total Protein: 7 g/dL (ref 6.0–8.3)

## 2018-12-09 LAB — CBC WITH DIFFERENTIAL/PLATELET
Basophils Absolute: 0 10*3/uL (ref 0.0–0.1)
Basophils Relative: 0.7 % (ref 0.0–3.0)
Eosinophils Absolute: 0.1 10*3/uL (ref 0.0–0.7)
Eosinophils Relative: 1.8 % (ref 0.0–5.0)
HCT: 39.6 % (ref 36.0–46.0)
Hemoglobin: 13.3 g/dL (ref 12.0–15.0)
Lymphocytes Relative: 30.6 % (ref 12.0–46.0)
Lymphs Abs: 1.4 10*3/uL (ref 0.7–4.0)
MCHC: 33.6 g/dL (ref 30.0–36.0)
MCV: 91.7 fl (ref 78.0–100.0)
Monocytes Absolute: 0.4 10*3/uL (ref 0.1–1.0)
Monocytes Relative: 7.7 % (ref 3.0–12.0)
Neutro Abs: 2.8 10*3/uL (ref 1.4–7.7)
Neutrophils Relative %: 59.2 % (ref 43.0–77.0)
Platelets: 258 10*3/uL (ref 150.0–400.0)
RBC: 4.32 Mil/uL (ref 3.87–5.11)
RDW: 13.1 % (ref 11.5–15.5)
WBC: 4.7 10*3/uL (ref 4.0–10.5)

## 2018-12-09 LAB — LIPID PANEL
Cholesterol: 284 mg/dL — ABNORMAL HIGH (ref 0–200)
HDL: 135.8 mg/dL (ref >39.00)
LDL Cholesterol: 140 mg/dL — ABNORMAL HIGH (ref 0–99)
NonHDL: 148.36
Total CHOL/HDL Ratio: 2
Triglycerides: 43 mg/dL (ref 0.0–149.0)
VLDL: 8.6 mg/dL (ref 0.0–40.0)

## 2018-12-09 LAB — TSH: TSH: 1.11 u[IU]/mL (ref 0.35–4.50)

## 2018-12-09 NOTE — Progress Notes (Signed)
Subjective  Chief Complaint  Patient presents with  . Annual Exam    Fasting.. Tdap today    HPI: Nicole Vazquez is a 48 y.o. female who presents to [place] at [facility] today for a Female Wellness Visit.   Wellness Visit: annual visit with health maintenance review and exam without Pap   HM: mammo due in September. Pap up to date. Doing well. No new concerns. Due tdap.   Neck fullness w/ history of cancer in family: thyroid ultrasound was normal. Will check tSH today was well.   Healthy and feels great. No concerns  Assessment  1. Annual physical exam   2. Breast cancer screening      Plan  Female Wellness Visit:  Age appropriate Health Maintenance and Prevention measures were discussed with patient. Included topics are cancer screening recommendations, ways to keep healthy (see AVS) including dietary and exercise recommendations, regular eye and dental care, use of seat belts, and avoidance of moderate alcohol use and tobacco use.   BMI: discussed patient's BMI and encouraged positive lifestyle modifications to help get to or maintain a target BMI.  HM needs and immunizations were addressed and ordered. See below for orders. See HM and immunization section for updates.  Routine labs and screening tests ordered including cmp, cbc and lipids where appropriate.  Discussed recommendations regarding Vit D and calcium supplementation (see AVS)  Follow up: Return in about 1 year (around 12/09/2019) for complete physical.   Orders Placed This Encounter  Procedures  . MM DIGITAL SCREENING BILATERAL  . CBC with Differential/Platelet  . Comprehensive metabolic panel  . Lipid panel  . TSH   No orders of the defined types were placed in this encounter.    Lifestyle: Body mass index is 20.67 kg/m. Wt Readings from Last 3 Encounters:  12/09/18 124 lb 3.2 oz (56.3 kg)  07/14/18 124 lb 12.8 oz (56.6 kg)     Patient Active Problem List   Diagnosis  Date Noted  . Seasonal allergic rhinitis due to pollen 07/14/2018  . Premature surgical menopause   . Basal cell carcinoma of skin 01/06/2017   Health Maintenance  Topic Date Due  . TETANUS/TDAP  12/05/1989  . INFLUENZA VACCINE  12/11/2018  . MAMMOGRAM  01/13/2019  . PAP SMEAR-Modifier  01/13/2021  . HIV Screening  Completed   Immunization History  Administered Date(s) Administered  . Influenza,inj,Quad PF,6+ Mos 07/14/2018   We updated and reviewed the patient's past history in detail and it is documented below. Allergies: Patient is allergic to guaifenesin & derivatives and vicodin [hydrocodone-acetaminophen]. Past Medical History Patient  has a past medical history of Premature surgical menopause and Seasonal allergic rhinitis due to pollen (07/14/2018). Past Surgical History Patient  has a past surgical history that includes Abdominal hysterectomy. Family History: Patient family history includes Arthritis in her father; Asthma in her mother; Atrial fibrillation in her father; Breast cancer in her mother; COPD in her paternal grandmother; Healthy in her son, son, and son; Hearing loss in her maternal grandmother; Heart disease in her paternal grandfather; Mental retardation in her sister. Social History:  Patient  reports that she has never smoked. She has never used smokeless tobacco. She reports that she does not drink alcohol or use drugs.  Review of Systems: Constitutional: negative for fever or malaise Ophthalmic: negative for photophobia, double vision or loss of vision Cardiovascular: negative for chest pain, dyspnea on exertion, or new LE swelling Respiratory: negative for SOB or persistent  cough Gastrointestinal: negative for abdominal pain, change in bowel habits or melena Genitourinary: negative for dysuria or gross hematuria, no abnormal uterine bleeding or disharge Musculoskeletal: negative for new gait disturbance or muscular weakness Integumentary: negative for  new or persistent rashes, no breast lumps Neurological: negative for TIA or stroke symptoms Psychiatric: negative for SI or delusions Allergic/Immunologic: negative for hives  Patient Care Team    Relationship Specialty Notifications Start End  Leamon Arnt, MD PCP - General Family Medicine  07/14/18   Ena Dawley, MD Consulting Physician Obstetrics and Gynecology  07/14/18     Objective  Vitals: BP 106/70   Pulse (!) 55   Temp 98.3 F (36.8 C) (Oral)   Resp 14   Ht 5\' 5"  (1.651 m)   Wt 124 lb 3.2 oz (56.3 kg)   SpO2 97%   BMI 20.67 kg/m  General:  Well developed, well nourished, no acute distress  Psych:  Alert and orientedx3,normal mood and affect HEENT:  Normocephalic, atraumatic, non-icteric sclera, PERRL, oropharynx is clear without mass or exudate, supple neck without adenopathy, mass or thyromegaly Cardiovascular:  Normal S1, S2, RRR without gallop, rub or murmur, nondisplaced PMI Respiratory:  Good breath sounds bilaterally, CTAB with normal respiratory effort Gastrointestinal: normal bowel sounds, soft, non-tender, no noted masses. No HSM MSK: no deformities, contusions. Joints are without erythema or swelling. Spine and CVA region are nontender Skin:  Warm, no rashes or suspicious lesions noted Neurologic:    Mental status is normal. CN 2-11 are normal. Gross motor and sensory exams are normal. Normal gait. No tremor Breast Exam: No mass, skin retraction or nipple discharge is appreciated in either breast. No axillary adenopathy. Fibrocystic changes are not noted   Commons side effects, risks, benefits, and alternatives for medications and treatment plan prescribed today were discussed, and the patient expressed understanding of the given instructions. Patient is instructed to call or message via MyChart if he/she has any questions or concerns regarding our treatment plan. No barriers to understanding were identified. We discussed Red Flag symptoms and signs in detail.  Patient expressed understanding regarding what to do in case of urgent or emergency type symptoms.   Medication list was reconciled, printed and provided to the patient in AVS. Patient instructions and summary information was reviewed with the patient as documented in the AVS. This note was prepared with assistance of Dragon voice recognition software. Occasional wrong-word or sound-a-like substitutions may have occurred due to the inherent limitations of voice recognition software

## 2018-12-09 NOTE — Patient Instructions (Addendum)
Please return in 12 months for your annual complete physical; please come fasting.  I will release your lab results to you on your MyChart account with further instructions. Please reply with any questions.   We will call you with information regarding your referral appointment. Mammogram at the [facility]. If you do not hear from [place] within the next 2 weeks, please let me know. It can take 1-2 weeks to get appointments set up with the specialists.   Today you were given your Tdap vaccination. This is good for 10 years and protects you from tetanus, pertussis and diptheria.   If you have any questions or concerns, please don't hesitate to send me a message via MyChart or call the office at [phone]. Thank you for visiting with [place] today! It's our pleasure caring for you.   Preventive Care 60-67 Years Old, Female Preventive care refers to visits with your health care provider and lifestyle choices that can promote health and wellness. This includes:  A yearly physical exam. This may also be called an annual well check.  Regular dental visits and eye exams.  Immunizations.  Screening for certain conditions.  Healthy lifestyle choices, such as eating a healthy diet, getting regular exercise, not using drugs or products that contain nicotine and tobacco, and limiting alcohol use. What can I expect for my preventive care visit? Physical exam Your health care provider will check your:  Height and weight. This may be used to calculate body mass index (BMI), which tells if you are at a healthy weight.  Heart rate and blood pressure.  Skin for abnormal spots. Counseling Your health care provider may ask you questions about your:  Alcohol, tobacco, and drug use.  Emotional well-being.  Home and relationship well-being.  Sexual activity.  Eating habits.  Work and work Statistician.  Method of birth control.  Menstrual cycle.  Pregnancy history. What immunizations do I  need?  Influenza (flu) vaccine  This is recommended every year. Tetanus, diphtheria, and pertussis (Tdap) vaccine  You may need a Td booster every 10 years. Varicella (chickenpox) vaccine  You may need this if you have not been vaccinated. Zoster (shingles) vaccine  You may need this after age 28. Measles, mumps, and rubella (MMR) vaccine  You may need at least one dose of MMR if you were born in 1957 or later. You may also need a second dose. Pneumococcal conjugate (PCV13) vaccine  You may need this if you have certain conditions and were not previously vaccinated. Pneumococcal polysaccharide (PPSV23) vaccine  You may need one or two doses if you smoke cigarettes or if you have certain conditions. Meningococcal conjugate (MenACWY) vaccine  You may need this if you have certain conditions. Hepatitis A vaccine  You may need this if you have certain conditions or if you travel or work in places where you may be exposed to hepatitis A. Hepatitis B vaccine  You may need this if you have certain conditions or if you travel or work in places where you may be exposed to hepatitis B. Haemophilus influenzae type b (Hib) vaccine  You may need this if you have certain conditions. Human papillomavirus (HPV) vaccine  If recommended by your health care provider, you may need three doses over 6 months. You may receive vaccines as individual doses or as more than one vaccine together in one shot (combination vaccines). Talk with your health care provider about the risks and benefits of combination vaccines. What tests do I need? Blood  tests  Lipid and cholesterol levels. These may be checked every 5 years, or more frequently if you are over 62 years old.  Hepatitis C test.  Hepatitis B test. Screening  Lung cancer screening. You may have this screening every year starting at age 64 if you have a 30-pack-year history of smoking and currently smoke or have quit within the past 15  years.  Colorectal cancer screening. All adults should have this screening starting at age 48 and continuing until age 42. Your health care provider may recommend screening at age 18 if you are at increased risk. You will have tests every 1-10 years, depending on your results and the type of screening test.  Diabetes screening. This is done by checking your blood sugar (glucose) after you have not eaten for a while (fasting). You may have this done every 1-3 years.  Mammogram. This may be done every 1-2 years. Talk with your health care provider about when you should start having regular mammograms. This may depend on whether you have a family history of breast cancer.  BRCA-related cancer screening. This may be done if you have a family history of breast, ovarian, tubal, or peritoneal cancers.  Pelvic exam and Pap test. This may be done every 3 years starting at age 13. Starting at age 14, this may be done every 5 years if you have a Pap test in combination with an HPV test. Other tests  Sexually transmitted disease (STD) testing.  Bone density scan. This is done to screen for osteoporosis. You may have this scan if you are at high risk for osteoporosis. Follow these instructions at home: Eating and drinking  Eat a diet that includes fresh fruits and vegetables, whole grains, lean protein, and low-fat dairy.  Take vitamin and mineral supplements as recommended by your health care provider.  Do not drink alcohol if: ? Your health care provider tells you not to drink. ? You are pregnant, may be pregnant, or are planning to become pregnant.  If you drink alcohol: ? Limit how much you have to 0-1 drink a day. ? Be aware of how much alcohol is in your drink. In the [place] one drink equals one 12 oz bottle of beer (355 mL), one 5 oz glass of wine (148 mL), or one 1 oz glass of hard liquor (44 mL). Lifestyle  Take daily care of your teeth and gums.  Stay active. Exercise for at least 30  minutes on 5 or more days each week.  Do not use any products that contain nicotine or tobacco, such as cigarettes, e-cigarettes, and chewing tobacco. If you need help quitting, ask your health care provider.  If you are sexually active, practice safe sex. Use a condom or other form of birth control (contraception) in order to prevent pregnancy and STIs (sexually transmitted infections).  If told by your health care provider, take low-dose aspirin daily starting at age 49. What's next?  Visit your health care provider once a year for a well check visit.  Ask your health care provider how often you should have your eyes and teeth checked.  Stay up to date on all vaccines. This information is not intended to replace advice given to you by your health care provider. Make sure you discuss any questions you have with your health care provider. Document Released: 05/25/2015 Document Revised: 01/07/2018 Document Reviewed: 01/07/2018 Elsevier Patient Education  2020 [employer].

## 2019-02-11 ENCOUNTER — Other Ambulatory Visit: Payer: Self-pay

## 2019-02-11 ENCOUNTER — Ambulatory Visit
Admission: RE | Admit: 2019-02-11 | Discharge: 2019-02-11 | Disposition: A | Discharge status: Home / Self Care | Payer: 59 | Source: Ambulatory Visit | Attending: Family Medicine | Admitting: Family Medicine

## 2019-02-11 DIAGNOSIS — Z1239 Encounter for other screening for malignant neoplasm of breast: Secondary | ICD-10-CM

## 2019-12-12 ENCOUNTER — Other Ambulatory Visit: Payer: Self-pay

## 2019-12-12 ENCOUNTER — Encounter: Payer: Self-pay | Admitting: Family Medicine

## 2019-12-12 ENCOUNTER — Ambulatory Visit (INDEPENDENT_AMBULATORY_CARE_PROVIDER_SITE_OTHER): Payer: Managed Care, Other (non HMO) | Admitting: Family Medicine

## 2019-12-12 VITALS — BP 110/78 | HR 54 | Temp 97.2°F | Resp 14 | Ht 65.0 in | Wt 129.6 lb

## 2019-12-12 DIAGNOSIS — E8940 Asymptomatic postprocedural ovarian failure: Secondary | ICD-10-CM

## 2019-12-12 DIAGNOSIS — Z803 Family history of malignant neoplasm of breast: Secondary | ICD-10-CM | POA: Diagnosis not present

## 2019-12-12 DIAGNOSIS — Z0000 Encounter for general adult medical examination without abnormal findings: Secondary | ICD-10-CM | POA: Diagnosis not present

## 2019-12-12 HISTORY — DX: Family history of malignant neoplasm of breast: Z80.3

## 2019-12-12 LAB — COMPREHENSIVE METABOLIC PANEL
AG Ratio: 1.8 (calc) (ref 1.0–2.5)
ALT: 14 U/L (ref 6–29)
AST: 20 U/L (ref 10–35)
Albumin: 4.6 g/dL (ref 3.6–5.1)
Alkaline phosphatase (APISO): 73 U/L (ref 31–125)
BUN: 18 mg/dL (ref 7–25)
CO2: 27 mmol/L (ref 20–32)
Calcium: 9.8 mg/dL (ref 8.6–10.2)
Chloride: 106 mmol/L (ref 98–110)
Creat: 0.89 mg/dL (ref 0.50–1.10)
Globulin: 2.6 g/dL (calc) (ref 1.9–3.7)
Glucose, Bld: 81 mg/dL (ref 65–99)
Potassium: 4.1 mmol/L (ref 3.5–5.3)
Sodium: 138 mmol/L (ref 135–146)
Total Bilirubin: 0.4 mg/dL (ref 0.2–1.2)
Total Protein: 7.2 g/dL (ref 6.1–8.1)

## 2019-12-12 NOTE — Progress Notes (Signed)
Subjective  Chief Complaint  Patient presents with  . Annual Exam    fasting, c/o dry cuticles    HPI: Nicole Vazquez is a 49 y.o. female who presents to [facility] [place] today for a Female Wellness Visit.   Wellness Visit: annual visit with health maintenance review and exam without Pap   HM: Nl pap and neg HPV in 2019, next due in 2024. mammo due in October. imms up to date. 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  Abdominal hysterectomy was supracervical, still requires Pap smears.  Low risk patient for cervical cancer.  Mammograms are done at the breast center.  Continues to live a very healthy lifestyle.  We discussed cholesterol levels, colon cancer screening options including colonoscopy and Cologuard.  Patient defers for now.  She is average risk.  He has history of thin ridged nails with dry cuticles.  Has discussed with her dermatologist.  Using this alone.  No pain or pus.   Assessment  1. Annual physical exam   2. Premature surgical menopause   3. Family history of breast cancer in mother      Plan  Female Wellness Visit:  Age appropriate Health Maintenance and Prevention measures were discussed with patient. Included topics are cancer screening recommendations, ways to keep healthy (see AVS) including dietary and exercise recommendations, regular eye and dental care, use of seat belts, and avoidance of moderate alcohol use and tobacco use.  Mammogram in October, she will follow-up with me for future Pap smears as her gynecologist has retired.  Discussed osteoporosis prevention with vitamin D, calcium and regular weightbearing exercise.  Will screen early at age 25.  Will defer Cologuard testing for colon cancer screening until age 3.  Discussed risks and benefits.  BMI: discussed patient's BMI and encouraged positive lifestyle modifications to help get to or maintain a target BMI.  HM needs and  immunizations were addressed and ordered. See below for orders. See HM and immunization section for updates.  Up-to-date  Routine labs and screening tests ordered including cmp, cbc and lipids where appropriate.  Discussed recommendations regarding Vit D and calcium supplementation (see AVS)  Follow up: Follow-up 1 year for complete physical  Orders Placed This Encounter  Procedures  . CBC with Differential/Platelet  . Comprehensive metabolic panel  . Hepatitis C antibody  . Lipid panel   No orders of the defined types were placed in this encounter.    Lifestyle: Body mass index is 21.57 kg/m. Wt Readings from Last 3 Encounters:  12/12/19 129 lb 9.6 oz (58.8 kg)  12/09/18 124 lb 3.2 oz (56.3 kg)  07/14/18 124 lb 12.8 oz (56.6 kg)     Patient Active Problem List   Diagnosis Date Noted  . Family history of breast cancer in mother 12/12/2019  . Seasonal allergic rhinitis due to pollen 07/14/2018  . Premature surgical menopause     Supracervical abd hysterectomy with bil salpingo-oopherectomy at age 70 due to atypical cells found on ovarian cyst; HRT for short period: stopped due to mother's history of breast cancer. Requires cervical cancer screens.   . Basal cell carcinoma of skin 01/06/2017   Health Maintenance  Topic Date Due  . Hepatitis C Screening  Never done  . INFLUENZA VACCINE  12/11/2019  . MAMMOGRAM  02/11/2020  . PAP SMEAR-Modifier  01/14/2023  . TETANUS/TDAP  12/08/2028  . COVID-19 Vaccine  Completed  . HIV Screening  Completed  Immunization History  Administered Date(s) Administered  . Influenza,inj,Quad PF,6+ Mos 07/14/2018  . Moderna SARS-COVID-2 Vaccination 07/29/2019, 08/29/2019  . Tdap 12/09/2018   We updated and reviewed the patient's past history in detail and it is documented below. Allergies: Patient is allergic to guaifenesin & derivatives and vicodin [hydrocodone-acetaminophen]. Past Medical History Patient  has a past medical history of  Family history of breast cancer in mother (12/12/2019), Premature surgical menopause, and Seasonal allergic rhinitis due to pollen (07/14/2018). Past Surgical History Patient  has a past surgical history that includes Abdominal hysterectomy. Family History: Patient family history includes Arthritis in her father; Asthma in her mother; Atrial fibrillation in her father; Breast cancer in her mother; COPD in her paternal grandmother; Healthy in her son, son, and son; Hearing loss in her maternal grandmother; Heart disease in her paternal grandfather; Mental retardation in her sister. Social History:  Patient  reports that she has never smoked. She has never used smokeless tobacco. She reports that she does not drink alcohol and does not use drugs.  Review of Systems: Constitutional: negative for fever or malaise Ophthalmic: negative for photophobia, double vision or loss of vision Cardiovascular: negative for chest pain, dyspnea on exertion, or new LE swelling Respiratory: negative for SOB or persistent cough Gastrointestinal: negative for abdominal pain, change in bowel habits or melena Genitourinary: negative for dysuria or gross hematuria, no abnormal uterine bleeding or disharge Musculoskeletal: negative for new gait disturbance or muscular weakness Integumentary: negative for new or persistent rashes, no breast lumps Neurological: negative for TIA or stroke symptoms Psychiatric: negative for SI or delusions Allergic/Immunologic: negative for hives  Patient Care Team    Relationship Specialty Notifications Start End  Leamon Arnt, MD PCP - General Family Medicine  07/14/18   Jari Pigg, MD Consulting Physician Dermatology  12/12/19     Objective  Vitals: BP 110/78   Pulse 54   Temp (!) 97.2 F (36.2 C) (Temporal)   Resp 14   Ht 5\' 5"  (1.651 m)   Wt 129 lb 9.6 oz (58.8 kg)   SpO2 98%   BMI 21.57 kg/m  General:  Well developed, well nourished, no acute distress  Psych:  Alert and  orientedx3,normal mood and affect HEENT:  Normocephalic, atraumatic, non-icteric sclera, supple neck without adenopathy, mass or thyromegaly Cardiovascular:  Normal S1, S2, RRR without gallop, rub or murmur Respiratory:  Good breath sounds bilaterally, CTAB with normal respiratory effort Gastrointestinal: normal bowel sounds, soft, non-tender, no noted masses. No HSM MSK: no deformities, contusions. Joints are without erythema or swelling.  Skin:  Warm, no rashes or suspicious lesions noted, heavily freckled Neurologic:    Mental status is normal. CN 2-11 are normal. Gross motor and sensory exams are normal. Normal gait. No tremor Breast Exam: No mass, skin retraction or nipple discharge is appreciated in either breast. No axillary adenopathy. Fibrocystic changes are not noted   Commons side effects, risks, benefits, and alternatives for medications and treatment plan prescribed today were discussed, and the patient expressed understanding of the given instructions. Patient is instructed to call or message via MyChart if he/she has any questions or concerns regarding our treatment plan. No barriers to understanding were identified. We discussed Red Flag symptoms and signs in detail. Patient expressed understanding regarding what to do in case of urgent or emergency type symptoms.   Medication list was reconciled, printed and provided to the patient in AVS. Patient instructions and summary information was reviewed with the patient as documented in  the AVS. This note was prepared with assistance of Dragon voice recognition software. Occasional wrong-word or sound-a-like substitutions may have occurred due to the inherent limitations of voice recognition software  This visit occurred during the SARS-CoV-2 public health emergency.  Safety protocols were in place, including screening questions prior to the visit, additional usage of staff PPE, and extensive cleaning of exam room while observing  appropriate contact time as indicated for disinfecting solutions.

## 2019-12-12 NOTE — Patient Instructions (Signed)
Please return in 12 months for your annual complete physical; please come fasting.  I will release your lab results to you on your MyChart account with further instructions. Please reply with any questions.    If you have any questions or concerns, please don't hesitate to send me a message via MyChart or call the office at [phone]. Thank you for visiting with [place] today! It's our pleasure caring for you.   Preventive Care 24-49 Years Old, Female Preventive care refers to visits with your health care provider and lifestyle choices that can promote health and wellness. This includes:  A yearly physical exam. This may also be called an annual well check.  Regular dental visits and eye exams.  Immunizations.  Screening for certain conditions.  Healthy lifestyle choices, such as eating a healthy diet, getting regular exercise, not using drugs or products that contain nicotine and tobacco, and limiting alcohol use. What can I expect for my preventive care visit? Physical exam Your health care provider will check your:  Height and weight. This may be used to calculate body mass index (BMI), which tells if you are at a healthy weight.  Heart rate and blood pressure.  Skin for abnormal spots. Counseling Your health care provider may ask you questions about your:  Alcohol, tobacco, and drug use.  Emotional well-being.  Home and relationship well-being.  Sexual activity.  Eating habits.  Work and work Statistician.  Method of birth control.  Menstrual cycle.  Pregnancy history. What immunizations do I need?  Influenza (flu) vaccine  This is recommended every year. Tetanus, diphtheria, and pertussis (Tdap) vaccine  You may need a Td booster every 10 years. Varicella (chickenpox) vaccine  You may need this if you have not been vaccinated. Zoster (shingles) vaccine  You may need this after age 72. Measles, mumps, and rubella (MMR) vaccine  You may need at least  one dose of MMR if you were born in 1957 or later. You may also need a second dose. Pneumococcal conjugate (PCV13) vaccine  You may need this if you have certain conditions and were not previously vaccinated. Pneumococcal polysaccharide (PPSV23) vaccine  You may need one or two doses if you smoke cigarettes or if you have certain conditions. Meningococcal conjugate (MenACWY) vaccine  You may need this if you have certain conditions. Hepatitis A vaccine  You may need this if you have certain conditions or if you travel or work in places where you may be exposed to hepatitis A. Hepatitis B vaccine  You may need this if you have certain conditions or if you travel or work in places where you may be exposed to hepatitis B. Haemophilus influenzae type b (Hib) vaccine  You may need this if you have certain conditions. Human papillomavirus (HPV) vaccine  If recommended by your health care provider, you may need three doses over 6 months. You may receive vaccines as individual doses or as more than one vaccine together in one shot (combination vaccines). Talk with your health care provider about the risks and benefits of combination vaccines. What tests do I need? Blood tests  Lipid and cholesterol levels. These may be checked every 5 years, or more frequently if you are over 74 years old.  Hepatitis C test.  Hepatitis B test. Screening  Lung cancer screening. You may have this screening every year starting at age 77 if you have a 30-pack-year history of smoking and currently smoke or have quit within the past 15 years.  Colorectal  cancer screening. All adults should have this screening starting at age 37 and continuing until age 48. Your health care provider may recommend screening at age 57 if you are at increased risk. You will have tests every 1-10 years, depending on your results and the type of screening test.  Diabetes screening. This is done by checking your blood sugar (glucose)  after you have not eaten for a while (fasting). You may have this done every 1-3 years.  Mammogram. This may be done every 1-2 years. Talk with your health care provider about when you should start having regular mammograms. This may depend on whether you have a family history of breast cancer.  BRCA-related cancer screening. This may be done if you have a family history of breast, ovarian, tubal, or peritoneal cancers.  Pelvic exam and Pap test. This may be done every 3 years starting at age 52. Starting at age 48, this may be done every 5 years if you have a Pap test in combination with an HPV test. Other tests  Sexually transmitted disease (STD) testing.  Bone density scan. This is done to screen for osteoporosis. You may have this scan if you are at high risk for osteoporosis. Follow these instructions at home: Eating and drinking  Eat a diet that includes fresh fruits and vegetables, whole grains, lean protein, and low-fat dairy.  Take vitamin and mineral supplements as recommended by your health care provider.  Do not drink alcohol if: ? Your health care provider tells you not to drink. ? You are pregnant, may be pregnant, or are planning to become pregnant.  If you drink alcohol: ? Limit how much you have to 0-1 drink a day. ? Be aware of how much alcohol is in your drink. In the [place] one drink equals one 12 oz bottle of beer (355 mL), one 5 oz glass of wine (148 mL), or one 1 oz glass of hard liquor (44 mL). Lifestyle  Take daily care of your teeth and gums.  Stay active. Exercise for at least 30 minutes on 5 or more days each week.  Do not use any products that contain nicotine or tobacco, such as cigarettes, e-cigarettes, and chewing tobacco. If you need help quitting, ask your health care provider.  If you are sexually active, practice safe sex. Use a condom or other form of birth control (contraception) in order to prevent pregnancy and STIs (sexually transmitted  infections).  If told by your health care provider, take low-dose aspirin daily starting at age 52. What's next?  Visit your health care provider once a year for a well check visit.  Ask your health care provider how often you should have your eyes and teeth checked.  Stay up to date on all vaccines. This information is not intended to replace advice given to you by your health care provider. Make sure you discuss any questions you have with your health care provider. Document Revised: 01/07/2018 Document Reviewed: 01/07/2018 Elsevier Patient Education  2020 [employer].

## 2019-12-13 LAB — CBC WITH DIFFERENTIAL/PLATELET
Absolute Monocytes: 269 cells/uL (ref 200–950)
Basophils Absolute: 31 cells/uL (ref 0–200)
Basophils Relative: 0.8 %
Eosinophils Absolute: 51 cells/uL (ref 15–500)
Eosinophils Relative: 1.3 %
HCT: 40.3 % (ref 35.0–45.0)
Hemoglobin: 13.4 g/dL (ref 11.7–15.5)
Lymphs Abs: 979 cells/uL (ref 850–3900)
MCH: 30.8 pg (ref 27.0–33.0)
MCHC: 33.3 g/dL (ref 32.0–36.0)
MCV: 92.6 fL (ref 80.0–100.0)
MPV: 9 fL (ref 7.5–12.5)
Monocytes Relative: 6.9 %
Neutro Abs: 2570 cells/uL (ref 1500–7800)
Neutrophils Relative %: 65.9 %
Platelets: 230 10*3/uL (ref 140–400)
RBC: 4.35 10*6/uL (ref 3.80–5.10)
RDW: 12.6 % (ref 11.0–15.0)
Total Lymphocyte: 25.1 %
WBC: 3.9 10*3/uL (ref 3.8–10.8)

## 2019-12-13 LAB — LIPID PANEL
Cholesterol: 264 mg/dL — ABNORMAL HIGH (ref <200)
HDL: 142 mg/dL (ref >=50)
LDL Cholesterol (Calc): 111 mg/dL (calc) — ABNORMAL HIGH
Non-HDL Cholesterol (Calc): 122 mg/dL (calc) (ref <130)
Total CHOL/HDL Ratio: 1.9 (calc) (ref <5.0)
Triglycerides: 38 mg/dL (ref <150)

## 2019-12-13 LAB — HEPATITIS C ANTIBODY
Hepatitis C Ab: NONREACTIVE
SIGNAL TO CUT-OFF: 0.01 (ref <1.00)

## 2020-01-09 ENCOUNTER — Other Ambulatory Visit: Payer: Self-pay | Admitting: Family Medicine

## 2020-01-09 DIAGNOSIS — Z1231 Encounter for screening mammogram for malignant neoplasm of breast: Secondary | ICD-10-CM

## 2020-02-14 ENCOUNTER — Other Ambulatory Visit: Payer: Self-pay

## 2020-02-14 ENCOUNTER — Ambulatory Visit
Admission: RE | Admit: 2020-02-14 | Discharge: 2020-02-14 | Disposition: A | Discharge status: Home / Self Care | Payer: Managed Care, Other (non HMO) | Source: Ambulatory Visit | Attending: Family Medicine | Admitting: Family Medicine

## 2020-02-14 DIAGNOSIS — Z1231 Encounter for screening mammogram for malignant neoplasm of breast: Secondary | ICD-10-CM

## 2020-10-11 ENCOUNTER — Encounter: Payer: Self-pay | Admitting: Family Medicine

## 2020-10-12 MED ORDER — ALPRAZOLAM 0.5 MG PO TABS
0.5000 mg | ORAL_TABLET | Freq: Every day | ORAL | 0 refills | Status: DC | PRN
Start: 2020-10-12 — End: 2022-10-03

## 2020-12-12 ENCOUNTER — Other Ambulatory Visit: Payer: Self-pay

## 2020-12-12 ENCOUNTER — Ambulatory Visit (INDEPENDENT_AMBULATORY_CARE_PROVIDER_SITE_OTHER): Payer: 59 | Admitting: Family Medicine

## 2020-12-12 ENCOUNTER — Encounter: Payer: Self-pay | Admitting: Family Medicine

## 2020-12-12 VITALS — BP 120/80 | HR 55 | Temp 97.3°F | Ht 65.0 in | Wt 117.2 lb

## 2020-12-12 DIAGNOSIS — Z1211 Encounter for screening for malignant neoplasm of colon: Secondary | ICD-10-CM

## 2020-12-12 DIAGNOSIS — Z0000 Encounter for general adult medical examination without abnormal findings: Secondary | ICD-10-CM | POA: Diagnosis not present

## 2020-12-12 DIAGNOSIS — F40243 Fear of flying: Secondary | ICD-10-CM | POA: Diagnosis not present

## 2020-12-12 DIAGNOSIS — Z23 Encounter for immunization: Secondary | ICD-10-CM

## 2020-12-12 DIAGNOSIS — Z1212 Encounter for screening for malignant neoplasm of rectum: Secondary | ICD-10-CM

## 2020-12-12 LAB — CBC WITH DIFFERENTIAL/PLATELET
Basophils Absolute: 0 10*3/uL (ref 0.0–0.1)
Basophils Relative: 0.7 % (ref 0.0–3.0)
Eosinophils Absolute: 0 10*3/uL (ref 0.0–0.7)
Eosinophils Relative: 1 % (ref 0.0–5.0)
HCT: 39.3 % (ref 36.0–46.0)
Hemoglobin: 13 g/dL (ref 12.0–15.0)
Lymphocytes Relative: 32.6 % (ref 12.0–46.0)
Lymphs Abs: 1 10*3/uL (ref 0.7–4.0)
MCHC: 33.2 g/dL (ref 30.0–36.0)
MCV: 94 fl (ref 78.0–100.0)
Monocytes Absolute: 0.3 10*3/uL (ref 0.1–1.0)
Monocytes Relative: 8.5 % (ref 3.0–12.0)
Neutro Abs: 1.8 10*3/uL (ref 1.4–7.7)
Neutrophils Relative %: 57.2 % (ref 43.0–77.0)
Platelets: 234 10*3/uL (ref 150.0–400.0)
RBC: 4.18 Mil/uL (ref 3.87–5.11)
RDW: 13.7 % (ref 11.5–15.5)
WBC: 3.1 10*3/uL — ABNORMAL LOW (ref 4.0–10.5)

## 2020-12-12 LAB — COMPREHENSIVE METABOLIC PANEL
ALT: 17 U/L (ref 0–35)
AST: 19 U/L (ref 0–37)
Albumin: 4.5 g/dL (ref 3.5–5.2)
Alkaline Phosphatase: 67 U/L (ref 39–117)
BUN: 11 mg/dL (ref 6–23)
CO2: 28 mEq/L (ref 19–32)
Calcium: 9.8 mg/dL (ref 8.4–10.5)
Chloride: 103 mEq/L (ref 96–112)
Creatinine, Ser: 0.87 mg/dL (ref 0.40–1.20)
GFR: 77.91 mL/min (ref >60.00)
Glucose, Bld: 81 mg/dL (ref 70–99)
Potassium: 4.3 mEq/L (ref 3.5–5.1)
Sodium: 139 mEq/L (ref 135–145)
Total Bilirubin: 0.5 mg/dL (ref 0.2–1.2)
Total Protein: 7.4 g/dL (ref 6.0–8.3)

## 2020-12-12 LAB — LIPID PANEL
Cholesterol: 248 mg/dL — ABNORMAL HIGH (ref 0–200)
HDL: 134.8 mg/dL (ref >39.00)
LDL Cholesterol: 107 mg/dL — ABNORMAL HIGH (ref 0–99)
NonHDL: 113.12
Total CHOL/HDL Ratio: 2
Triglycerides: 33 mg/dL (ref 0.0–149.0)
VLDL: 6.6 mg/dL (ref 0.0–40.0)

## 2020-12-12 NOTE — Progress Notes (Signed)
Subjective  Chief Complaint  Patient presents with   Annual Exam    HPI: Nicole Vazquez is a 50 y.o. female who presents to [place] at [place] today for a Female Wellness Visit. She also has the concerns and/or needs as listed above in the chief complaint. These will be addressed in addition to the Health Maintenance Visit.   Wellness Visit: annual visit with health maintenance review and exam without Pap  Health maintenance: Mammogram due this fall.  Pap smear up-to-date.  Due colorectal cancer screening.  Average risk. Social: Unfortunately, separated from her husband 3 to 4 months ago.  Difficult during but she is doing well overall.  3 children, youngest is 71 and living at home.  She lost weight due to difficulty eating at first, but she says these things have resolved.  No mood concerns   Assessment  1. Annual physical exam   2. Screening for colorectal cancer   3. Phobia, flying   4. Need for zoster vaccination      Plan  Female Wellness Visit: Age appropriate Health Maintenance and Prevention measures were discussed with patient. Included topics are cancer screening recommendations, ways to keep healthy (see AVS) including dietary and exercise recommendations, regular eye and dental care, use of seat belts, and avoidance of moderate alcohol use and tobacco use.  Cologuard collected colorectal cancer screening.  Education given BMI: discussed patient's BMI and encouraged positive lifestyle modifications to help get to or maintain a target BMI. HM needs and immunizations were addressed and ordered. See below for orders. See HM and immunization section for updates.  Shingrix, dose 1 given today second dose due in 6 months Routine labs and screening tests ordered including cmp, cbc and lipids where appropriate. Discussed recommendations regarding Vit D and calcium supplementation (see AVS)   Follow up: 12 months for complete physical Orders Placed  This Encounter  Procedures   Varicella-zoster vaccine IM (Shingrix)   CBC with Differential/Platelet   Comprehensive metabolic panel   Cologuard   Lipid panel   No orders of the defined types were placed in this encounter.     Body mass index is 19.51 kg/m. 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 Requires cervical cancer screens.     Basal cell carcinoma of skin 01/06/2017   Health Maintenance  Topic Date Due   Pneumococcal Vaccine 68-17 Years old (1 - PCV) Never done   COLONOSCOPY (Pts 45-30yr Insurance coverage will need to be confirmed)  Never done   INFLUENZA VACCINE  12/10/2020   Zoster Vaccines- Shingrix (2 of 2) 02/06/2021   MAMMOGRAM  02/13/2021   COVID-19 Vaccine (5 - Booster for Moderna series) 03/07/2021   PAP SMEAR-Modifier  01/14/2023   TETANUS/TDAP  12/08/2028   Hepatitis C Screening  Completed   HIV Screening  Completed   HPV VACCINES  Aged Out  Immunization History  Administered Date(s) Administered   Influenza,inj,Quad PF,6+ Mos 07/14/2018   Moderna Sars-Covid-2 Vaccination 07/29/2019, 08/29/2019, 03/22/2020, 11/05/2020   Tdap 12/09/2018   Zoster Recombinat (Shingrix) 12/12/2020   We updated and reviewed the patient's past history in detail and it is documented below. Allergies: Patient is allergic to guaifenesin & derivatives and vicodin [hydrocodone-acetaminophen]. Past Medical History Patient  has a past medical history  of Family history of breast cancer in mother (12/12/2019), Premature surgical menopause, and Seasonal allergic rhinitis due to pollen (07/14/2018). Past Surgical History Patient  has a past surgical history that includes Abdominal hysterectomy. Family History: Patient family history includes Arthritis in her father; Asthma in her mother; Atrial fibrillation in her father; Breast cancer in her mother; COPD in her paternal grandmother; Healthy in her son, son, and son; Hearing loss in her maternal grandmother; Heart disease in her paternal grandfather; Mental retardation in her sister. Social History:  Patient  reports that she has never smoked. She has never used smokeless tobacco. She reports that she does not drink alcohol and does not use drugs.  Review of Systems: Constitutional: negative for fever or malaise Ophthalmic: negative for photophobia, double vision or loss of vision Cardiovascular: negative for chest pain, dyspnea on exertion, or new LE swelling Respiratory: negative for SOB or persistent cough Gastrointestinal: negative for abdominal pain, change in bowel habits or melena Genitourinary: negative for dysuria or gross hematuria, no abnormal uterine bleeding or disharge Musculoskeletal: negative for new gait disturbance or muscular weakness Integumentary: negative for new or persistent rashes, no breast lumps Neurological: negative for TIA or stroke symptoms Psychiatric: negative for SI or delusions Allergic/Immunologic: negative for hives  Patient Care Team    Relationship Specialty Notifications Start End  Leamon Arnt, MD PCP - General Family Medicine  07/14/18   Jari Pigg, MD Consulting Physician Dermatology  12/12/19    Wt Readings from Last 3 Encounters:  12/12/20 117 lb 4 oz (53.2 kg)  12/12/19 129 lb 9.6 oz (58.8 kg)  12/09/18 124 lb 3.2 oz (56.3 kg)    Objective  Vitals: BP 120/80   Pulse (!) 55   Temp (!) 97.3 F (36.3 C)   Ht '5\' 5"'$  (1.651 m)   Wt 117 lb 4  oz (53.2 kg)   SpO2 100%   BMI 19.51 kg/m  General:  Well developed, well nourished, no acute distress  Psych:  Alert and orientedx3,normal mood and affect HEENT:  Normocephalic, atraumatic, non-icteric sclera,  supple neck without adenopathy, mass or thyromegaly Cardiovascular:  Normal S1, S2, RRR without gallop, rub or murmur Respiratory:  Good breath sounds bilaterally, CTAB with normal respiratory effort Gastrointestinal: normal bowel sounds, soft, non-tender, no noted masses. No HSM MSK: no deformities, contusions. Joints are without erythema or swelling.  Skin:  Warm, no rashes or suspicious lesions noted Neurologic:    Mental status is normal. CN 2-11 are normal. Gross motor and sensory exams are normal. Normal gait. No tremor   Commons side effects, risks, benefits, and alternatives for medications and treatment plan prescribed today were discussed, and the patient expressed understanding of the given instructions. Patient is instructed to call or message via MyChart if he/she has any questions or concerns regarding our treatment plan. No barriers to understanding were identified. We discussed Red Flag symptoms and signs in detail. Patient expressed understanding regarding what to do in case of urgent or emergency type symptoms.  Medication list was reconciled, printed and provided to the patient in AVS. Patient  instructions and summary information was reviewed with the patient as documented in the AVS. This note was prepared with assistance of Dragon voice recognition software. Occasional wrong-word or sound-a-like substitutions may have occurred due to the inherent limitations of voice recognition software  This visit occurred during the SARS-CoV-2 public health emergency.  Safety protocols were in place, including screening questions prior to the visit, additional usage of staff PPE, and extensive cleaning of exam room while observing appropriate contact time as indicated for  disinfecting solutions.

## 2020-12-12 NOTE — Patient Instructions (Addendum)
Please return in 12 months for your annual complete physical; please come fasting.   I will release your lab results to you on your MyChart account with further instructions. Please reply with any questions.    Glad you are overall doing well. You have the perfect outlook! And time will help. Take care of yourself.   If you have any questions or concerns, please don't hesitate to send me a message via MyChart or call the office at [phone]. Thank you for visiting with [place] today! It's our pleasure caring for you.   Please do these things to maintain good health!  Exercise at least 30-45 minutes a day,  4-5 days a week.  Eat a low-fat diet with lots of fruits and vegetables, up to 7-9 servings per day. Drink plenty of water daily. Try to drink 8 8oz glasses per day. Seatbelts can save your life. Always wear your seatbelt. Place Smoke Detectors on every level of your home and check batteries every year. Schedule an appointment with an eye doctor for an eye exam every 1-2 years Safe sex - use condoms to protect yourself from STDs if you could be exposed to these types of infections. Use birth control if you do not want to become pregnant and are sexually active. Avoid heavy alcohol use. If you drink, keep it to less than 2 drinks/day and not every day. [place].  Choose someone you trust that could speak for you if you became unable to speak for yourself. Depression is common in our stressful world.If you're feeling down or losing interest in things you normally enjoy, please come in for a visit. If anyone is threatening or hurting you, please get help. Physical or Emotional Violence is never OK.

## 2021-01-03 ENCOUNTER — Other Ambulatory Visit: Payer: Self-pay | Admitting: Family Medicine

## 2021-01-03 DIAGNOSIS — Z1231 Encounter for screening mammogram for malignant neoplasm of breast: Secondary | ICD-10-CM

## 2021-02-14 ENCOUNTER — Ambulatory Visit
Admission: RE | Admit: 2021-02-14 | Discharge: 2021-02-14 | Disposition: A | Discharge status: Home / Self Care | Payer: 59 | Source: Ambulatory Visit | Attending: Family Medicine | Admitting: Family Medicine

## 2021-02-14 ENCOUNTER — Other Ambulatory Visit: Payer: Self-pay

## 2021-02-14 DIAGNOSIS — Z1231 Encounter for screening mammogram for malignant neoplasm of breast: Secondary | ICD-10-CM

## 2021-02-20 ENCOUNTER — Ambulatory Visit (INDEPENDENT_AMBULATORY_CARE_PROVIDER_SITE_OTHER): Payer: 59

## 2021-02-20 ENCOUNTER — Other Ambulatory Visit: Payer: Self-pay

## 2021-02-20 DIAGNOSIS — Z23 Encounter for immunization: Secondary | ICD-10-CM | POA: Diagnosis not present

## 2021-02-20 NOTE — Progress Notes (Signed)
Nicole Vazquez 50 yr old female presents to office today for seond shingles vaccine per Billey Chang, MD. Administered SHINGRIX 0.5 mL IM left arm. Patient tolerated well.

## 2021-12-03 DIAGNOSIS — F4322 Adjustment disorder with anxiety: Secondary | ICD-10-CM | POA: Diagnosis not present

## 2021-12-13 ENCOUNTER — Encounter: Payer: Self-pay | Admitting: Family Medicine

## 2021-12-23 DIAGNOSIS — F4322 Adjustment disorder with anxiety: Secondary | ICD-10-CM | POA: Diagnosis not present

## 2021-12-27 ENCOUNTER — Encounter: Payer: Self-pay | Admitting: Family Medicine

## 2021-12-27 ENCOUNTER — Ambulatory Visit (INDEPENDENT_AMBULATORY_CARE_PROVIDER_SITE_OTHER): Payer: BC Managed Care – PPO | Admitting: Family Medicine

## 2021-12-27 VITALS — BP 132/76 | HR 53 | Temp 98.6°F | Ht 65.0 in | Wt 118.6 lb

## 2021-12-27 DIAGNOSIS — Z1211 Encounter for screening for malignant neoplasm of colon: Secondary | ICD-10-CM

## 2021-12-27 DIAGNOSIS — Z0000 Encounter for general adult medical examination without abnormal findings: Secondary | ICD-10-CM | POA: Diagnosis not present

## 2021-12-27 DIAGNOSIS — Z1212 Encounter for screening for malignant neoplasm of rectum: Secondary | ICD-10-CM | POA: Diagnosis not present

## 2021-12-27 DIAGNOSIS — R7989 Other specified abnormal findings of blood chemistry: Secondary | ICD-10-CM | POA: Diagnosis not present

## 2021-12-27 LAB — LIPID PANEL
Cholesterol: 249 mg/dL — ABNORMAL HIGH (ref 0–200)
HDL: 115.2 mg/dL (ref >39.00)
LDL Cholesterol: 127 mg/dL — ABNORMAL HIGH (ref 0–99)
NonHDL: 133.88
Total CHOL/HDL Ratio: 2
Triglycerides: 34 mg/dL (ref 0.0–149.0)
VLDL: 6.8 mg/dL (ref 0.0–40.0)

## 2021-12-27 LAB — COMPREHENSIVE METABOLIC PANEL
ALT: 14 U/L (ref 0–35)
AST: 17 U/L (ref 0–37)
Albumin: 4.7 g/dL (ref 3.5–5.2)
Alkaline Phosphatase: 74 U/L (ref 39–117)
BUN: 14 mg/dL (ref 6–23)
CO2: 28 mEq/L (ref 19–32)
Calcium: 9.7 mg/dL (ref 8.4–10.5)
Chloride: 104 mEq/L (ref 96–112)
Creatinine, Ser: 0.9 mg/dL (ref 0.40–1.20)
GFR: 74.25 mL/min (ref >60.00)
Glucose, Bld: 92 mg/dL (ref 70–99)
Potassium: 5.2 mEq/L — ABNORMAL HIGH (ref 3.5–5.1)
Sodium: 140 mEq/L (ref 135–145)
Total Bilirubin: 0.5 mg/dL (ref 0.2–1.2)
Total Protein: 7.1 g/dL (ref 6.0–8.3)

## 2021-12-27 LAB — CBC WITH DIFFERENTIAL/PLATELET
Basophils Absolute: 0 10*3/uL (ref 0.0–0.1)
Basophils Relative: 0.7 % (ref 0.0–3.0)
Eosinophils Absolute: 0 10*3/uL (ref 0.0–0.7)
Eosinophils Relative: 0.8 % (ref 0.0–5.0)
HCT: 40.5 % (ref 36.0–46.0)
Hemoglobin: 13.8 g/dL (ref 12.0–15.0)
Lymphocytes Relative: 28.3 % (ref 12.0–46.0)
Lymphs Abs: 1.2 10*3/uL (ref 0.7–4.0)
MCHC: 34.1 g/dL (ref 30.0–36.0)
MCV: 92 fl (ref 78.0–100.0)
Monocytes Absolute: 0.2 10*3/uL (ref 0.1–1.0)
Monocytes Relative: 5.2 % (ref 3.0–12.0)
Neutro Abs: 2.8 10*3/uL (ref 1.4–7.7)
Neutrophils Relative %: 65 % (ref 43.0–77.0)
Platelets: 237 10*3/uL (ref 150.0–400.0)
RBC: 4.4 Mil/uL (ref 3.87–5.11)
RDW: 12.5 % (ref 11.5–15.5)
WBC: 4.4 10*3/uL (ref 4.0–10.5)

## 2021-12-27 NOTE — Patient Instructions (Signed)
Please return in 12 months for your annual complete physical; please come fasting.   I will release your lab results to you on your MyChart account with further instructions. You may see the results before I do, but when I review them I will send you a message with my report or have my assistant call you if things need to be discussed. Please reply to my message with any questions. Thank you!   I recommend the Cologuard test for your colon cancer screening that is due. I have ordered this test for you. The Cologuard company will soon contact you to verify your insurance, address etc. They will then send you the kit; follow the instructions in the kit and return the kit to Colog[employer]. They will run the test and send the results to me. I will then give you the results. If this test is negative, we recommend repeating a colon cancer screening test in 3 years. If it is positive, I will refer you to a Gastroenterologist so you can get set up for the recommended colonoscopy.  Thank you!   If you have any questions or concerns, please don't hesitate to send me a message via MyChart or call the office at [phone]. Thank you for visiting with us today! It's our pleasure caring for you.   Please do these things to maintain good health!  Exercise at least 30-45 minutes a day,  4-5 days a week.  Eat a low-fat diet with lots of fruits and vegetables, up to 7-9 servings per day. Drink plenty of water daily. Try to drink 8 8oz glasses per day. Seatbelts can save your life. Always wear your seatbelt. Place Smoke Detectors on every level of your home and check batteries every year. Schedule an appointment with an eye doctor for an eye exam every 1-2 years Safe sex - use condoms to protect yourself from STDs if you could be exposed to these types of infections. Use birth control if you do not want to become pregnant and are sexually active. Avoid heavy alcohol use. If you drink, keep it to less than 2 drinks/day  and not every day. Health Care Power of Attorney.  Choose someone you trust that could speak for you if you became unable to speak for yourself. Depression is common in our stressful world.If you're feeling down or losing interest in things you normally enjoy, please come in for a visit. If anyone is threatening or hurting you, please get help. Physical or Emotional Violence is never OK.   

## 2021-12-27 NOTE — Progress Notes (Signed)
Subjective  Chief Complaint  Patient presents with   Annual Exam    Pt here for Annual exam and is currently fasting     HPI: Nicole Vazquez is a 51 y.o. female who presents to [facility] at [place] today for a Female Wellness Visit. She also has the concerns and/or needs as listed above in the chief complaint. These will be addressed in addition to the Health Maintenance Visit.   Wellness Visit: annual visit with health maintenance review and exam without Pap  HM: due colorectal ca screen: discussed options again: avg risk. Elects cologuard. Mammo due end of this year. Pap current. Imms up to date. Healthy lifestyle. Exercises. Eats well. Separated a year now from exhusband and doing well now.  High HLD   Assessment  1. Annual physical exam   2. High serum high density lipoprotein (HDL)   3. Screening for colorectal cancer      Plan  Female Wellness Visit: Age appropriate Health Maintenance and Prevention measures were discussed with patient. Included topics are cancer screening recommendations, ways to keep healthy (see AVS) including dietary and exercise recommendations, regular eye and dental care, use of seat belts, and avoidance of moderate alcohol use and tobacco use. Cologuard ordered and education given.  BMI: discussed patient's BMI and encouraged positive lifestyle modifications to help get to or maintain a target BMI. HM needs and immunizations were addressed and ordered. See below for orders. See HM and immunization section for updates. Routine labs and screening tests ordered including cmp, cbc and lipids where appropriate. Discussed recommendations regarding Vit D and calcium supplementation (see AVS)  Follow up: Return in about 1 year (around 12/28/2022) for complete physical.  Orders Placed This Encounter  Procedures   CBC with Differential/Platelet   Comprehensive metabolic panel   Lipid panel   Cologuard   No orders of the defined  types were placed in this encounter.     Body mass index is 19.74 kg/m. 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 Requires cervical cancer screens.    Basal cell carcinoma of skin 01/06/2017   Health Maintenance  Topic Date Due   Fecal DNA (Cologuard)  Never done   INFLUENZA VACCINE  12/10/2021   COVID-19 Vaccine (5 - Moderna risk series) 01/12/2022 (Originally 12/31/2020)   MAMMOGRAM  02/14/2022   PAP SMEAR-Modifier  01/14/2023   TETANUS/TDAP  12/08/2028   Hepatitis C Screening  Completed   HIV Screening  Completed   Zoster Vaccines- Shingrix  Completed   HPV VACCINES  Aged Out   Immunization History  Administered Date(s) Administered   Influenza,inj,Quad PF,6+ Mos 07/14/2018   Moderna Sars-Covid-2 Vaccination 07/29/2019, 08/29/2019, 03/22/2020, 11/05/2020   Tdap 12/09/2018   Zoster Recombinat (Shingrix) 12/12/2020, 02/20/2021   We updated and reviewed the  patient's past history in detail and it is documented below. Allergies: Patient is allergic to guaifenesin & derivatives and vicodin [hydrocodone-acetaminophen]. Past Medical History Patient  has a past medical history of Family history of breast cancer in mother (12/12/2019), Premature surgical menopause, and Seasonal allergic rhinitis due to pollen (07/14/2018). Past Surgical History Patient  has a past surgical history that  includes Abdominal hysterectomy. Family History: Patient family history includes Arthritis in her father; Asthma in her mother; Atrial fibrillation in her father; Breast cancer in her mother; COPD in her paternal grandmother; Healthy in her son, son, and son; Hearing loss in her maternal grandmother; Heart disease in her paternal grandfather; Mental retardation in her sister. Social History:  Patient  reports that she has never smoked. She has never used smokeless tobacco. She reports that she does not drink alcohol and does not use drugs.  Review of Systems: Constitutional: negative for fever or malaise Ophthalmic: negative for photophobia, double vision or loss of vision Cardiovascular: negative for chest pain, dyspnea on exertion, or new LE swelling Respiratory: negative for SOB or persistent cough Gastrointestinal: negative for abdominal pain, change in bowel habits or melena Genitourinary: negative for dysuria or gross hematuria, no abnormal uterine bleeding or disharge Musculoskeletal: negative for new gait disturbance or muscular weakness Integumentary: negative for new or persistent rashes, no breast lumps Neurological: negative for TIA or stroke symptoms Psychiatric: negative for SI or delusions Allergic/Immunologic: negative for hives  Patient Care Team    Relationship Specialty Notifications Start End  Leamon Arnt, MD PCP - General Family Medicine  07/14/18   Jari Pigg, MD Consulting Physician Dermatology  12/12/19     Objective  Vitals: BP 132/76   Pulse (!) 53   Temp 98.6 F (37 C)   Ht '5\' 5"'$  (1.651 m)   Wt 118 lb 9.6 oz (53.8 kg)   SpO2 98%   BMI 19.74 kg/m  General:  Well developed, well nourished, no acute distress  Psych:  Alert and orientedx3,normal mood and affect HEENT:  Normocephalic, atraumatic, non-icteric sclera,  supple neck without adenopathy, mass or thyromegaly Cardiovascular:  Normal S1, S2, RRR without gallop, rub or murmur Respiratory:  Good  breath sounds bilaterally, CTAB with normal respiratory effort Gastrointestinal: normal bowel sounds, soft, non-tender, no noted masses. No HSM MSK: no deformities, contusions. Joints are without erythema or swelling.  Skin:  Warm, no rashes or suspicious lesions noted, generalized freckles (skin screens with derm annually) Neurologic:    Mental status is normal. Gross motor and sensory exams are normal. Normal gait. No tremor  Commons side effects, risks, benefits, and alternatives for medications and treatment plan prescribed today were discussed, and the patient expressed understanding of the given instructions. Patient is instructed to call or message via MyChart if he/she has any questions or concerns regarding our treatment plan. No barriers to understanding were identified. We discussed Red Flag symptoms and signs in detail. Patient expressed understanding regarding what to do in case of urgent or emergency type symptoms.  Medication list was reconciled, printed and provided to the patient in AVS. Patient instructions and summary information was reviewed with the patient as documented in the AVS. This note was prepared with assistance of Dragon voice recognition software. Occasional wrong-word or sound-a-like substitutions may have occurred due to the inherent limitations of voice recognition software  This visit occurred during the SARS-CoV-2 public health emergency.  Safety protocols were in place, including screening questions prior to the visit, additional usage of staff  PPE, and extensive cleaning of exam room while observing appropriate contact time as indicated for disinfecting solutions.

## 2022-01-01 ENCOUNTER — Other Ambulatory Visit: Payer: Self-pay | Admitting: Family Medicine

## 2022-01-01 DIAGNOSIS — Z1231 Encounter for screening mammogram for malignant neoplasm of breast: Secondary | ICD-10-CM

## 2022-01-09 DIAGNOSIS — F4322 Adjustment disorder with anxiety: Secondary | ICD-10-CM | POA: Diagnosis not present

## 2022-01-20 DIAGNOSIS — F4322 Adjustment disorder with anxiety: Secondary | ICD-10-CM | POA: Diagnosis not present

## 2022-01-22 DIAGNOSIS — Z1212 Encounter for screening for malignant neoplasm of rectum: Secondary | ICD-10-CM | POA: Diagnosis not present

## 2022-01-22 DIAGNOSIS — Z1211 Encounter for screening for malignant neoplasm of colon: Secondary | ICD-10-CM | POA: Diagnosis not present

## 2022-01-27 LAB — COLOGUARD: COLOGUARD: NEGATIVE

## 2022-02-03 ENCOUNTER — Encounter: Payer: Self-pay | Admitting: *Deleted

## 2022-02-17 ENCOUNTER — Ambulatory Visit
Admission: RE | Admit: 2022-02-17 | Discharge: 2022-02-17 | Disposition: A | Discharge status: Home / Self Care | Payer: BC Managed Care – PPO | Source: Ambulatory Visit | Attending: Family Medicine | Admitting: Family Medicine

## 2022-02-17 DIAGNOSIS — Z1231 Encounter for screening mammogram for malignant neoplasm of breast: Secondary | ICD-10-CM

## 2022-02-18 DIAGNOSIS — F4322 Adjustment disorder with anxiety: Secondary | ICD-10-CM | POA: Diagnosis not present

## 2022-03-10 DIAGNOSIS — F4322 Adjustment disorder with anxiety: Secondary | ICD-10-CM | POA: Diagnosis not present

## 2022-03-31 DIAGNOSIS — F4322 Adjustment disorder with anxiety: Secondary | ICD-10-CM | POA: Diagnosis not present

## 2022-04-16 IMAGING — MG MM DIGITAL SCREENING BILAT W/ TOMO AND CAD
8 series · 9 of 24 positions shown · non-contrast
Comparison: Previous exam(s).

CLINICAL DATA: Screening.

EXAM:
DIGITAL SCREENING BILATERAL MAMMOGRAM WITH TOMOSYNTHESIS AND CAD
TECHNIQUE: Bilateral screening digital craniocaudal and mediolateral oblique
mammograms were obtained. Bilateral screening digital breast
tomosynthesis was performed. The images were evaluated with
computer-aided detection.

[L MLO synth-2D]
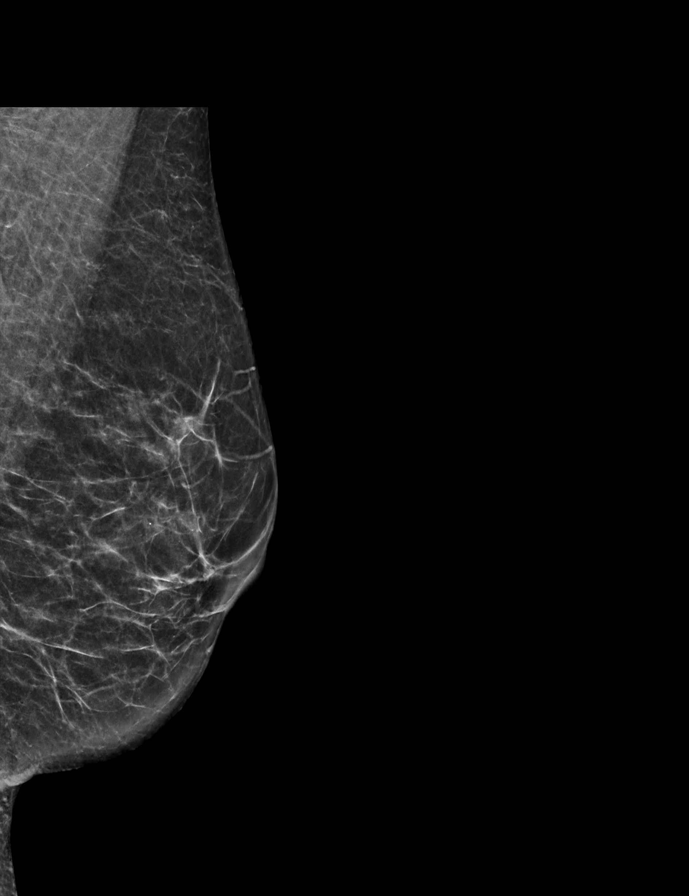

[R CC synth-2D]
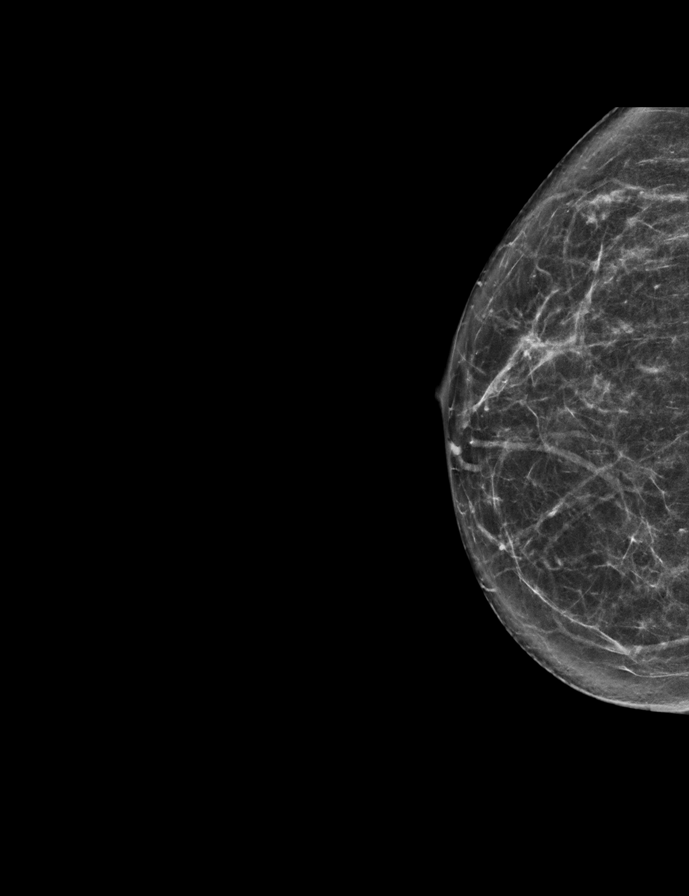

[R MLO synth-2D]
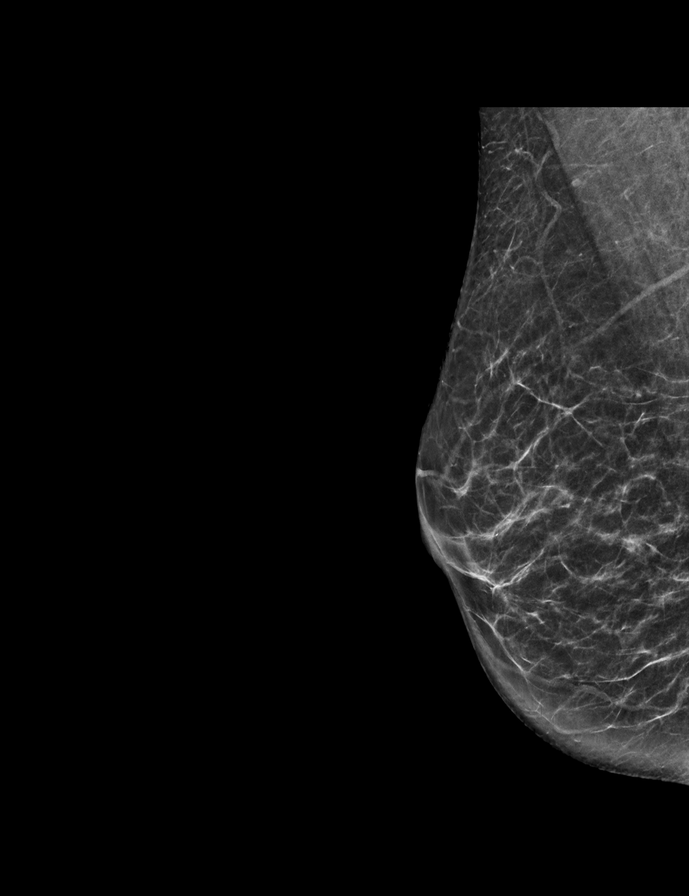

[L CC synth-2D]
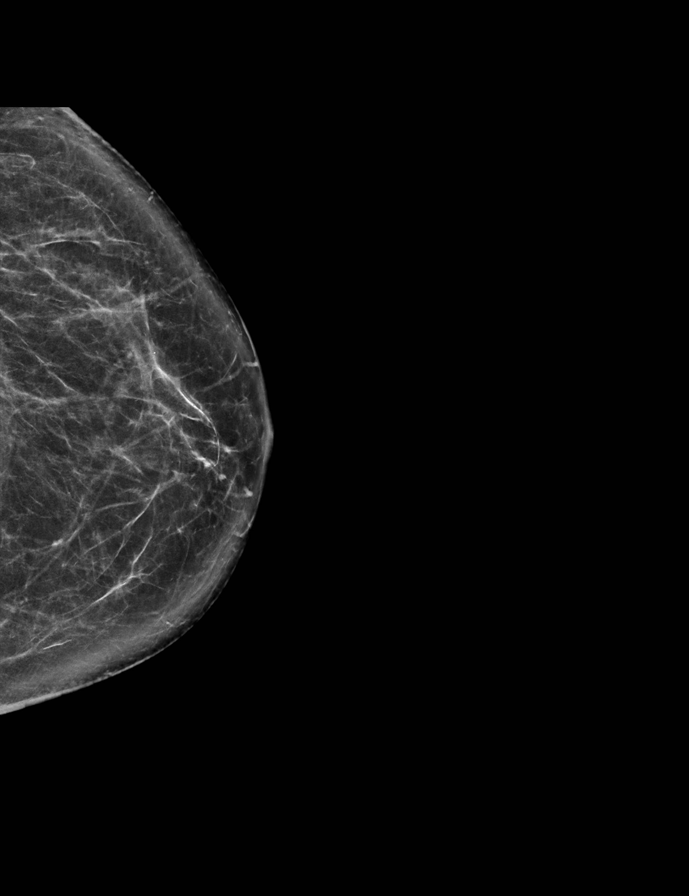

[R MLO tomo · 2 of 61 frames shown]
[frame 20/61]
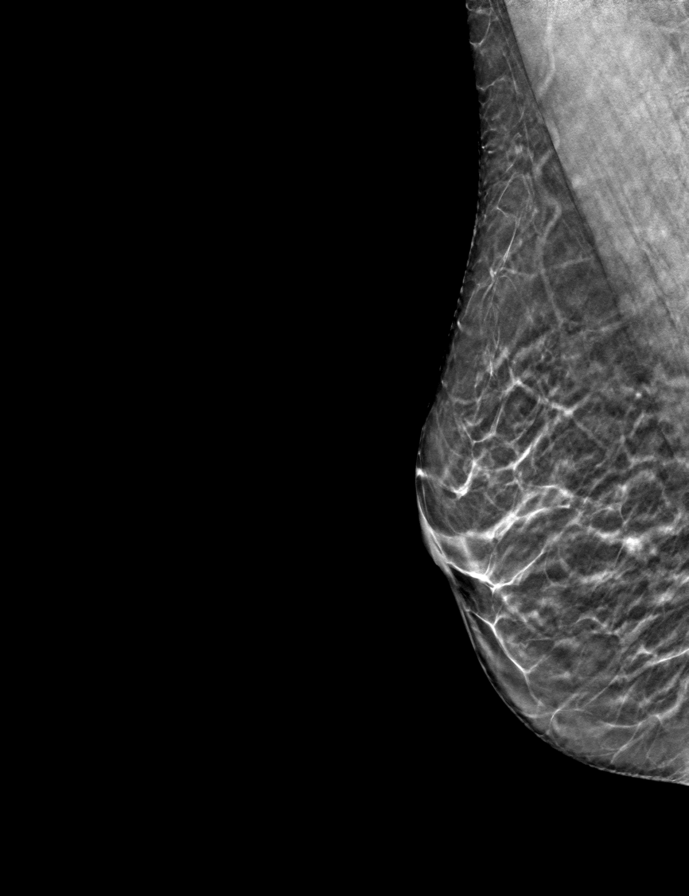
[frame 31/61]
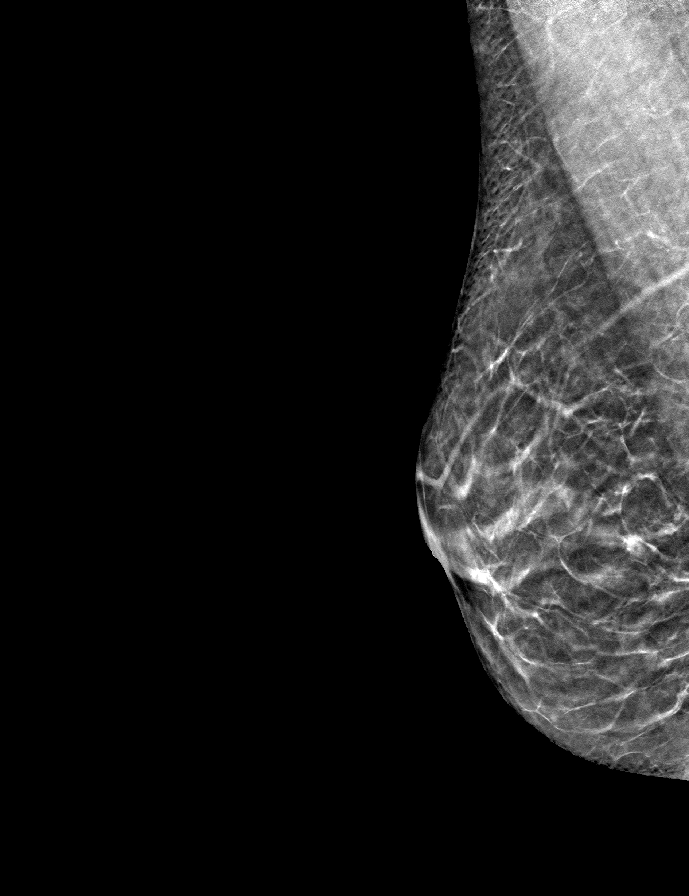

[L MLO tomo · tomo slice 31/62.0]
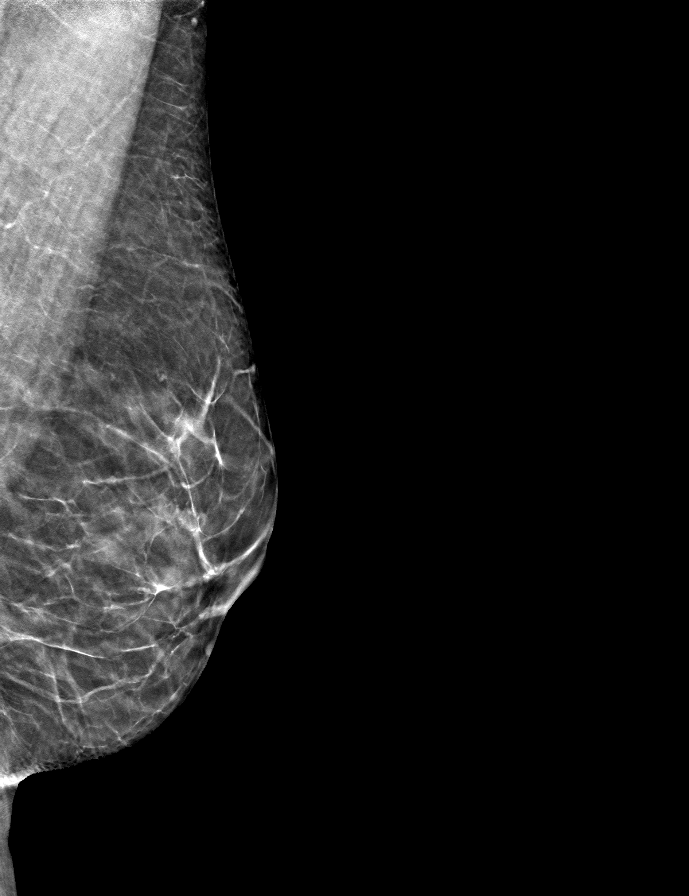

[R CC tomo · tomo slice 31/60.0]
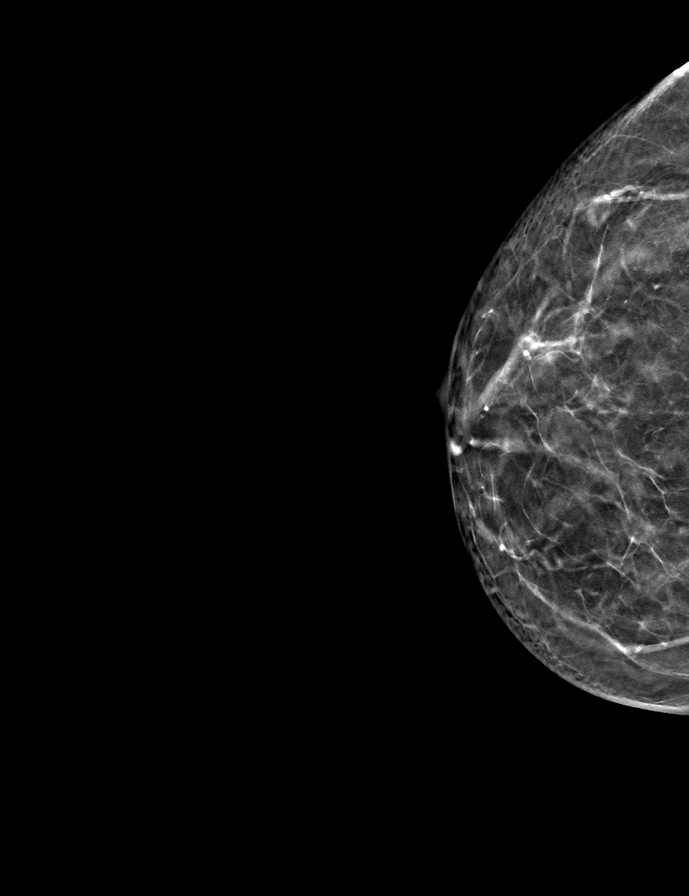

[L CC tomo · tomo slice 35/69.0]
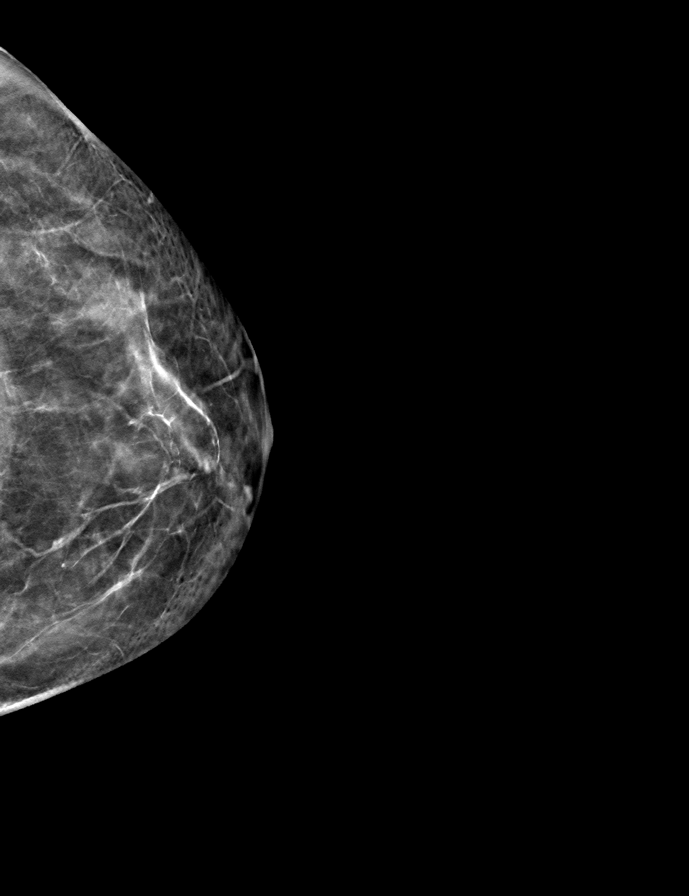

[9 of 24 positions shown; findings below may reference images not displayed]

ACR Breast Density Category b: There are scattered areas of
fibroglandular density.
FINDINGS: There are no findings suspicious for malignancy.
IMPRESSION: No mammographic evidence of malignancy. A result letter of this
screening mammogram will be mailed directly to the patient.

RECOMMENDATION:
Screening mammogram in one year. (Code:51-O-LD2)

BI-RADS CATEGORY  1: Negative.

## 2022-04-24 ENCOUNTER — Encounter: Payer: Self-pay | Admitting: *Deleted

## 2022-05-01 DIAGNOSIS — F4322 Adjustment disorder with anxiety: Secondary | ICD-10-CM | POA: Diagnosis not present

## 2022-05-29 DIAGNOSIS — F4322 Adjustment disorder with anxiety: Secondary | ICD-10-CM | POA: Diagnosis not present

## 2022-07-24 DIAGNOSIS — F4322 Adjustment disorder with anxiety: Secondary | ICD-10-CM | POA: Diagnosis not present

## 2022-09-17 DIAGNOSIS — F4322 Adjustment disorder with anxiety: Secondary | ICD-10-CM | POA: Diagnosis not present

## 2022-09-30 DIAGNOSIS — L570 Actinic keratosis: Secondary | ICD-10-CM | POA: Diagnosis not present

## 2022-09-30 DIAGNOSIS — L578 Other skin changes due to chronic exposure to nonionizing radiation: Secondary | ICD-10-CM | POA: Diagnosis not present

## 2022-09-30 DIAGNOSIS — L814 Other melanin hyperpigmentation: Secondary | ICD-10-CM | POA: Diagnosis not present

## 2022-09-30 DIAGNOSIS — D485 Neoplasm of uncertain behavior of skin: Secondary | ICD-10-CM | POA: Diagnosis not present

## 2022-09-30 DIAGNOSIS — D225 Melanocytic nevi of trunk: Secondary | ICD-10-CM | POA: Diagnosis not present

## 2022-09-30 DIAGNOSIS — C44519 Basal cell carcinoma of skin of other part of trunk: Secondary | ICD-10-CM | POA: Diagnosis not present

## 2022-09-30 DIAGNOSIS — L821 Other seborrheic keratosis: Secondary | ICD-10-CM | POA: Diagnosis not present

## 2022-10-03 ENCOUNTER — Other Ambulatory Visit: Payer: Self-pay | Admitting: Family Medicine

## 2022-10-03 MED ORDER — ALPRAZOLAM 0.5 MG PO TABS: 0.5000 mg | ORAL_TABLET | Freq: Every day | ORAL | 0 refills | Status: AC | PRN

## 2022-10-03 NOTE — Telephone Encounter (Signed)
Patient wants to make sure that medication is sent to new pharmacy [facility] [address] [place], [place] [address].

## 2022-10-03 NOTE — Telephone Encounter (Signed)
LOV: 12/27/2021  Last refill date: 10/12/2020  Qty: 15  Refills 0

## 2022-10-10 NOTE — Telephone Encounter (Signed)
Pt is asking to change rx to another pharmacy due to pharmacy being temporarily closed.  RX:  ALPRAZolam Prudy Feeler) 0.5 MG tablet   Pharmacy:  [facility] [phone] - [place], [place] - [address] Phone: [phone]  Fax: [phone]

## 2022-10-14 ENCOUNTER — Other Ambulatory Visit: Payer: Self-pay

## 2022-10-14 ENCOUNTER — Telehealth: Payer: Self-pay

## 2022-10-14 NOTE — Telephone Encounter (Signed)
Received a message from the front desk stating that patient called in stating that [facility] in [facility] was closed and that she needed her Xanax script sent to another location. I called [facility] and verified that they are open and that her script is ready to be picked up. LMOVM advising patient that script is ready for pick up.

## 2022-11-10 DIAGNOSIS — F4322 Adjustment disorder with anxiety: Secondary | ICD-10-CM | POA: Diagnosis not present

## 2022-12-23 DIAGNOSIS — L988 Other specified disorders of the skin and subcutaneous tissue: Secondary | ICD-10-CM | POA: Diagnosis not present

## 2022-12-23 DIAGNOSIS — C44519 Basal cell carcinoma of skin of other part of trunk: Secondary | ICD-10-CM | POA: Diagnosis not present

## 2022-12-25 ENCOUNTER — Encounter (INDEPENDENT_AMBULATORY_CARE_PROVIDER_SITE_OTHER): Payer: Self-pay

## 2022-12-29 ENCOUNTER — Encounter: Payer: BC Managed Care – PPO | Admitting: Family Medicine

## 2022-12-29 DIAGNOSIS — F4322 Adjustment disorder with anxiety: Secondary | ICD-10-CM | POA: Diagnosis not present

## 2023-01-06 ENCOUNTER — Other Ambulatory Visit: Payer: Self-pay | Admitting: Family Medicine

## 2023-01-06 DIAGNOSIS — Z1231 Encounter for screening mammogram for malignant neoplasm of breast: Secondary | ICD-10-CM

## 2023-02-04 ENCOUNTER — Encounter: Payer: BC Managed Care – PPO | Admitting: Family Medicine

## 2023-02-20 ENCOUNTER — Ambulatory Visit
Admission: RE | Admit: 2023-02-20 | Discharge: 2023-02-20 | Disposition: A | Discharge status: Home / Self Care | Payer: BC Managed Care – PPO | Source: Ambulatory Visit | Attending: Family Medicine | Admitting: Family Medicine

## 2023-02-20 DIAGNOSIS — Z1231 Encounter for screening mammogram for malignant neoplasm of breast: Secondary | ICD-10-CM

## 2023-03-10 DIAGNOSIS — F4322 Adjustment disorder with anxiety: Secondary | ICD-10-CM | POA: Diagnosis not present

## 2023-03-30 ENCOUNTER — Other Ambulatory Visit (HOSPITAL_COMMUNITY)
Admission: RE | Admit: 2023-03-30 | Discharge: 2023-03-30 | Disposition: A | Discharge status: Home / Self Care | Payer: BC Managed Care – PPO | Source: Ambulatory Visit | Attending: Family Medicine | Admitting: Family Medicine

## 2023-03-30 ENCOUNTER — Ambulatory Visit: Payer: BC Managed Care – PPO | Admitting: Family Medicine

## 2023-03-30 ENCOUNTER — Encounter: Payer: Self-pay | Admitting: Family Medicine

## 2023-03-30 VITALS — BP 127/84 | HR 62 | Temp 97.9°F | Ht 65.0 in | Wt 122.6 lb

## 2023-03-30 DIAGNOSIS — Z0000 Encounter for general adult medical examination without abnormal findings: Secondary | ICD-10-CM

## 2023-03-30 DIAGNOSIS — Z124 Encounter for screening for malignant neoplasm of cervix: Secondary | ICD-10-CM | POA: Insufficient documentation

## 2023-03-30 DIAGNOSIS — F40243 Fear of flying: Secondary | ICD-10-CM | POA: Diagnosis not present

## 2023-03-30 DIAGNOSIS — Z803 Family history of malignant neoplasm of breast: Secondary | ICD-10-CM

## 2023-03-30 DIAGNOSIS — E8940 Asymptomatic postprocedural ovarian failure: Secondary | ICD-10-CM

## 2023-03-30 LAB — COMPREHENSIVE METABOLIC PANEL
ALT: 14 U/L (ref 0–35)
AST: 19 U/L (ref 0–37)
Albumin: 4.6 g/dL (ref 3.5–5.2)
Alkaline Phosphatase: 64 U/L (ref 39–117)
BUN: 12 mg/dL (ref 6–23)
CO2: 28 meq/L (ref 19–32)
Calcium: 9.6 mg/dL (ref 8.4–10.5)
Chloride: 102 meq/L (ref 96–112)
Creatinine, Ser: 0.9 mg/dL (ref 0.40–1.20)
GFR: 73.6 mL/min (ref >60.00)
Glucose, Bld: 90 mg/dL (ref 70–99)
Potassium: 3.9 meq/L (ref 3.5–5.1)
Sodium: 139 meq/L (ref 135–145)
Total Bilirubin: 0.6 mg/dL (ref 0.2–1.2)
Total Protein: 7.1 g/dL (ref 6.0–8.3)

## 2023-03-30 LAB — CBC WITH DIFFERENTIAL/PLATELET
Basophils Absolute: 0 10*3/uL (ref 0.0–0.1)
Basophils Relative: 0.5 % (ref 0.0–3.0)
Eosinophils Absolute: 0 10*3/uL (ref 0.0–0.7)
Eosinophils Relative: 0.9 % (ref 0.0–5.0)
HCT: 39.4 % (ref 36.0–46.0)
Hemoglobin: 13.2 g/dL (ref 12.0–15.0)
Lymphocytes Relative: 41.6 % (ref 12.0–46.0)
Lymphs Abs: 1.6 10*3/uL (ref 0.7–4.0)
MCHC: 33.5 g/dL (ref 30.0–36.0)
MCV: 92.8 fL (ref 78.0–100.0)
Monocytes Absolute: 0.2 10*3/uL (ref 0.1–1.0)
Monocytes Relative: 6.2 % (ref 3.0–12.0)
Neutro Abs: 2 10*3/uL (ref 1.4–7.7)
Neutrophils Relative %: 50.8 % (ref 43.0–77.0)
Platelets: 259 10*3/uL (ref 150.0–400.0)
RBC: 4.24 Mil/uL (ref 3.87–5.11)
RDW: 13.5 % (ref 11.5–15.5)
WBC: 3.9 10*3/uL — ABNORMAL LOW (ref 4.0–10.5)

## 2023-03-30 LAB — LIPID PANEL
Cholesterol: 278 mg/dL — ABNORMAL HIGH (ref 0–200)
HDL: 111.1 mg/dL (ref >39.00)
LDL Cholesterol: 158 mg/dL — ABNORMAL HIGH (ref 0–99)
NonHDL: 166.69
Total CHOL/HDL Ratio: 3
Triglycerides: 41 mg/dL (ref 0.0–149.0)
VLDL: 8.2 mg/dL (ref 0.0–40.0)

## 2023-03-30 NOTE — Patient Instructions (Signed)
Please return in 12 months for your annual complete physical; please come fasting.   I will release your lab results to you on your MyChart account with further instructions. You may see the results before I do, but when I review them I will send you a message with my report or have my assistant call you if things need to be discussed. Please reply to my message with any questions. Thank you!   If you have any questions or concerns, please don't hesitate to send me a message via MyChart or call the office at [phone]. Thank you for visiting with [place] today! It's our pleasure caring for you.   Preventive Care 53-71 Years Old, Female Preventive care refers to lifestyle choices and visits with your health care provider that can promote health and wellness. Preventive care visits are also called wellness exams. What can I expect for my preventive care visit? Counseling Your health care provider may ask you questions about your: Medical history, including: Past medical problems. Family medical history. Pregnancy history. Current health, including: Menstrual cycle. Method of birth control. Emotional well-being. Home life and relationship well-being. Sexual activity and sexual health. Lifestyle, including: Alcohol, nicotine or tobacco, and drug use. Access to firearms. Diet, exercise, and sleep habits. Work and work Astronomer. Sunscreen use. Safety issues such as seatbelt and bike helmet use. Physical exam Your health care provider will check your: Height and weight. These may be used to calculate your BMI (body mass index). BMI is a measurement that tells if you are at a healthy weight. Waist circumference. This measures the distance around your waistline. This measurement also tells if you are at a healthy weight and may help predict your risk of certain diseases, such as type 2 diabetes and high blood pressure. Heart rate and blood pressure. Body temperature. Skin for abnormal  spots. What immunizations do I need?  Vaccines are usually given at various ages, according to a schedule. Your health care provider will recommend vaccines for you based on your age, medical history, and lifestyle or other factors, such as travel or where you work. What tests do I need? Screening Your health care provider may recommend screening tests for certain conditions. This may include: Lipid and cholesterol levels. Diabetes screening. This is done by checking your blood sugar (glucose) after you have not eaten for a while (fasting). Pelvic exam and Pap test. Hepatitis B test. Hepatitis C test. HIV (human immunodeficiency virus) test. STI (sexually transmitted infection) testing, if you are at risk. Lung cancer screening. Colorectal cancer screening. Mammogram. Talk with your health care provider about when you should start having regular mammograms. This may depend on whether you have a family history of breast cancer. BRCA-related cancer screening. This may be done if you have a family history of breast, ovarian, tubal, or peritoneal cancers. Bone density scan. This is done to screen for osteoporosis. Talk with your health care provider about your test results, treatment options, and if necessary, the need for more tests. Follow these instructions at home: Eating and drinking  Eat a diet that includes fresh fruits and vegetables, whole grains, lean protein, and low-fat dairy products. Take vitamin and mineral supplements as recommended by your health care provider. Do not drink alcohol if: Your health care provider tells you not to drink. You are pregnant, may be pregnant, or are planning to become pregnant. If you drink alcohol: Limit how much you have to 0-1 drink a day. Know how much alcohol is in your  drink. In the [place] one drink equals one 12 oz bottle of beer (355 mL), one 5 oz glass of wine (148 mL), or one 1 oz glass of hard liquor (44 mL). Lifestyle Brush your  teeth every morning and night with fluoride toothpaste. Floss one time each day. Exercise for at least 30 minutes 5 or more days each week. Do not use any products that contain nicotine or tobacco. These products include cigarettes, chewing tobacco, and vaping devices, such as e-cigarettes. If you need help quitting, ask your health care provider. Do not use drugs. If you are sexually active, practice safe sex. Use a condom or other form of protection to prevent STIs. If you do not wish to become pregnant, use a form of birth control. If you plan to become pregnant, see your health care provider for a prepregnancy visit. Take aspirin only as told by your health care provider. Make sure that you understand how much to take and what form to take. Work with your health care provider to find out whether it is safe and beneficial for you to take aspirin daily. Find healthy ways to manage stress, such as: Meditation, yoga, or listening to music. Journaling. Talking to a trusted person. Spending time with friends and family. Minimize exposure to UV radiation to reduce your risk of skin cancer. Safety Always wear your seat belt while driving or riding in a vehicle. Do not drive: If you have been drinking alcohol. Do not ride with someone who has been drinking. When you are tired or distracted. While texting. If you have been using any mind-altering substances or drugs. Wear a helmet and other protective equipment during sports activities. If you have firearms in your house, make sure you follow all gun safety procedures. Seek help if you have been physically or sexually abused. What's next? Visit your health care provider once a year for an annual wellness visit. Ask your health care provider how often you should have your eyes and teeth checked. Stay up to date on all vaccines. This information is not intended to replace advice given to you by your health care provider. Make sure you discuss any  questions you have with your health care provider. Document Revised: 10/24/2020 Document Reviewed: 10/24/2020 Elsevier Patient Education  2024 [employer].

## 2023-03-30 NOTE — Progress Notes (Signed)
Subjective  Chief Complaint  Patient presents with   Annual Exam    Pt here for annual exam w/o any concerns - pt non-fasting, pt still has cervix, ok with pap smear today per previous conversation with Dr Mardelle Matte    HPI: Nicole Vazquez is a 52 y.o. female who presents to [facility] [place] today for a Female Wellness Visit. She also has the concerns and/or needs as listed above in the chief complaint. These will be addressed in addition to the Health Maintenance Visit.   Wellness Visit: annual visit with health maintenance review and exam  HM: due pap smear. Healthy. No concerns Chronic disease f/u and/or acute problem visit: (deemed necessary to be done in addition to the wellness visit): Uses rare xanax for flying Basal cell removed by derm recently Mammo up to date  Assessment  1. Annual physical exam   2. Cervical cancer screening   3. Phobia, flying   4. Premature surgical menopause   5. Family history of breast cancer in mother      Plan  Female Wellness Visit: Age appropriate Health Maintenance and Prevention measures were discussed with patient. Included topics are cancer screening recommendations, ways to keep healthy (see AVS) including dietary and exercise recommendations, regular eye and dental care, use of seat belts, and avoidance of moderate alcohol use and tobacco use. Pap today. Neg cologuard last year. Mammo current.  BMI: discussed patient's BMI and encouraged positive lifestyle modifications to help get to or maintain a target BMI. HM needs and immunizations were addressed and ordered. See below for orders. See HM and immunization section for updates. Routine labs and screening tests ordered including cmp, cbc and lipids where appropriate. Discussed recommendations regarding Vit D and calcium supplementation (see AVS)  Chronic disease management visit and/or acute problem visit: Rare xanax use Follow up: 12 mo for cpe  Orders Placed  This Encounter  Procedures   Lipid panel   Comprehensive metabolic panel   CBC with Differential/Platelet   No orders of the defined types were placed in this encounter.     Body mass index is 20.4 kg/m. 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 Requires cervical cancer screens.    Basal cell carcinoma of skin 01/06/2017    Health Maintenance  Topic Date Due   Cervical Cancer Screening (HPV/Pap Cotest)  01/13/2023   COVID-19 Vaccine (6 - 2023-24 season) 04/07/2023   MAMMOGRAM  02/20/2024   Fecal DNA (Cologuard)  01/22/2025   DTaP/Tdap/Td (2 - Td or Tdap) 12/08/2028   INFLUENZA VACCINE  Completed   Hepatitis C Screening  Completed   HIV Screening  Completed   Zoster Vaccines- Shingrix  Completed   HPV VACCINES  Aged [employer]  History  Administered Date(s) Administered   Influenza,inj,Quad PF,6+ Mos 07/14/2018   Influenza-Unspecified 02/10/2023   Moderna Sars-Covid-2 Vaccination 07/29/2019, 08/29/2019, 03/22/2020, 11/05/2020   Tdap 12/09/2018   Unspecified SARS-COV-2 Vaccination 02/10/2023   Zoster Recombinant(Shingrix) 12/12/2020, 02/20/2021   We updated and reviewed the patient's past history in detail and it is documented below. Allergies: Patient is allergic to guaifenesin & derivatives and vicodin [hydrocodone-acetaminophen]. Past Medical  History Patient  has a past medical history of Family history of breast cancer in mother (12/12/2019), Premature surgical menopause, and Seasonal allergic rhinitis due to pollen (07/14/2018). Past Surgical History Patient  has a past surgical history that includes Abdominal hysterectomy. Family History: Patient family history includes Arthritis in her father; Asthma in her mother; Atrial fibrillation in her father; Breast cancer in her mother; COPD in her paternal grandmother; Healthy in her son, son, and son; Hearing loss in her maternal grandmother; Heart disease in her paternal grandfather; Mental retardation in her sister. Social History:  Patient  reports that she has never smoked. She has never used smokeless tobacco. She reports that she does not drink alcohol and does not use drugs.  Review of Systems: Constitutional: negative for fever or malaise Ophthalmic: negative for photophobia, double vision or loss of vision Cardiovascular: negative for chest pain, dyspnea on exertion, or new LE swelling Respiratory: negative for SOB or persistent cough Gastrointestinal: negative for abdominal pain, change in bowel habits or melena Genitourinary: negative for dysuria or gross hematuria, no abnormal uterine bleeding or disharge Musculoskeletal: negative for new gait disturbance or muscular weakness Integumentary: negative for new or persistent rashes, no breast lumps Neurological: negative for TIA or stroke symptoms Psychiatric: negative for SI or delusions Allergic/Immunologic: negative for hives  Patient Care Team    Relationship Specialty Notifications Start End  Willow Ora, MD PCP - General Family Medicine  07/14/18   Elmon Else, MD Consulting Physician Dermatology  12/12/19     Objective  Vitals: BP 127/84   Pulse 62   Temp 97.9 F (36.6 C)   Ht 5\' 5"  (1.651 m)   Wt 122 lb 9.6 oz (55.6 kg)   SpO2 99%   BMI 20.40 kg/m  General:  Well developed, well nourished, no acute distress   Psych:  Alert and orientedx3,normal mood and affect HEENT:  Normocephalic, atraumatic, non-icteric sclera,  supple neck without adenopathy, mass or thyromegaly Cardiovascular:  Normal S1, S2, RRR without gallop, rub or murmur Respiratory:  Good breath sounds bilaterally, CTAB with normal respiratory effort Gastrointestinal: normal bowel sounds, soft, non-tender, no noted masses. No HSM MSK: extremities without edema, joints without erythema or swelling Neurologic:    Mental status is normal.  Gross motor and sensory exams are normal.  No tremor Pelvic Exam: Normal external genitalia, no vulvar or vaginal lesions present. Clear cervix w/o CMT. No inguinal adenopathy. A PAP smear was performed.    Commons side effects, risks, benefits, and alternatives for medications and treatment plan prescribed today were discussed, and the patient expressed understanding of the given instructions. Patient is instructed to call or message via MyChart if he/she has any questions or concerns regarding our treatment plan. No barriers to understanding were identified. We discussed Red Flag symptoms and signs in detail. Patient expressed understanding regarding what to do in case of urgent or emergency type symptoms.  Medication list was reconciled, printed and provided to the patient in AVS. Patient instructions and summary information was reviewed with the patient as documented in the AVS. This note  was prepared with assistance of Conservation officer, historic buildings. Occasional wrong-word or sound-a-like substitutions may have occurred due to the inherent limitations of voice recognition software

## 2023-04-01 LAB — CYTOLOGY - PAP
Comment: NEGATIVE
Diagnosis: NEGATIVE
High risk HPV: NEGATIVE

## 2023-04-02 NOTE — Progress Notes (Signed)
Labs reviewed.  The ASCVD Risk score (Arnett DK, et al., 2019) failed to calculate for the following reasons:   The valid HDL cholesterol range is 20 to 100 mg/dL  Dear Ms. Nicole Vazquez, Thank you for allowing me to care for you at your recent office visit.  I wanted to let you know that I have reviewed your lab test results and am happy to report that they are normal.  Your Pap smear remains normal as well.  Your cholesterol levels are elevated, however your HDL or happy cholesterol level is extremely high which is protective.  Monitor your diet and limit high cholesterol foods if possible.  No medications are needed at this time.  I am so happy that you are thriving! Sincerely, Dr. Mardelle Matte

## 2023-04-07 DIAGNOSIS — F4322 Adjustment disorder with anxiety: Secondary | ICD-10-CM | POA: Diagnosis not present

## 2023-05-19 DIAGNOSIS — F4322 Adjustment disorder with anxiety: Secondary | ICD-10-CM | POA: Diagnosis not present

## 2023-06-03 DIAGNOSIS — H40013 Open angle with borderline findings, low risk, bilateral: Secondary | ICD-10-CM | POA: Diagnosis not present

## 2023-06-25 DIAGNOSIS — F4322 Adjustment disorder with anxiety: Secondary | ICD-10-CM | POA: Diagnosis not present

## 2023-07-14 DIAGNOSIS — F4322 Adjustment disorder with anxiety: Secondary | ICD-10-CM | POA: Diagnosis not present

## 2023-08-27 DIAGNOSIS — F4322 Adjustment disorder with anxiety: Secondary | ICD-10-CM | POA: Diagnosis not present

## 2023-09-21 DIAGNOSIS — F4322 Adjustment disorder with anxiety: Secondary | ICD-10-CM | POA: Diagnosis not present

## 2023-10-13 DIAGNOSIS — F4322 Adjustment disorder with anxiety: Secondary | ICD-10-CM | POA: Diagnosis not present

## 2023-12-16 DIAGNOSIS — F4322 Adjustment disorder with anxiety: Secondary | ICD-10-CM | POA: Diagnosis not present

## 2024-01-08 DIAGNOSIS — L03011 Cellulitis of right finger: Secondary | ICD-10-CM | POA: Diagnosis not present

## 2024-01-08 DIAGNOSIS — L570 Actinic keratosis: Secondary | ICD-10-CM | POA: Diagnosis not present

## 2024-01-08 DIAGNOSIS — D225 Melanocytic nevi of trunk: Secondary | ICD-10-CM | POA: Diagnosis not present

## 2024-01-08 DIAGNOSIS — L578 Other skin changes due to chronic exposure to nonionizing radiation: Secondary | ICD-10-CM | POA: Diagnosis not present

## 2024-01-08 DIAGNOSIS — Z85828 Personal history of other malignant neoplasm of skin: Secondary | ICD-10-CM | POA: Diagnosis not present

## 2024-01-19 DIAGNOSIS — F4322 Adjustment disorder with anxiety: Secondary | ICD-10-CM | POA: Diagnosis not present

## 2024-02-22 DIAGNOSIS — F4322 Adjustment disorder with anxiety: Secondary | ICD-10-CM | POA: Diagnosis not present

## 2024-04-12 ENCOUNTER — Telehealth: Payer: Self-pay

## 2024-04-12 NOTE — Telephone Encounter (Signed)
 Sent patient My Chart Message
